# Patient Record
Sex: Female | Born: 1965 | Race: White | Hispanic: No | Marital: Married | Smoking: Former smoker
Health system: Southern US, Community
[De-identification: ages and names within clinical notes are randomized; demographics above are authoritative.]

## PROBLEM LIST (undated history)

## (undated) DIAGNOSIS — F32A Depression, unspecified: Secondary | ICD-10-CM

## (undated) DIAGNOSIS — F419 Anxiety disorder, unspecified: Secondary | ICD-10-CM

## (undated) DIAGNOSIS — J302 Other seasonal allergic rhinitis: Secondary | ICD-10-CM

## (undated) DIAGNOSIS — F329 Major depressive disorder, single episode, unspecified: Secondary | ICD-10-CM

## (undated) DIAGNOSIS — E785 Hyperlipidemia, unspecified: Secondary | ICD-10-CM

## (undated) HISTORY — DX: Major depressive disorder, single episode, unspecified: F32.9

## (undated) HISTORY — DX: Anxiety disorder, unspecified: F41.9

## (undated) HISTORY — PX: COLONOSCOPY: SHX174

## (undated) HISTORY — DX: Hyperlipidemia, unspecified: E78.5

## (undated) HISTORY — DX: Depression, unspecified: F32.A

## (undated) HISTORY — DX: Other seasonal allergic rhinitis: J30.2

---

## 1985-10-05 HISTORY — PX: TUBAL LIGATION: SHX77

## 1998-05-09 ENCOUNTER — Other Ambulatory Visit: Admission: RE | Admit: 1998-05-09 | Discharge: 1998-05-09 | Payer: Self-pay

## 2001-08-16 ENCOUNTER — Other Ambulatory Visit: Admission: RE | Admit: 2001-08-16 | Discharge: 2001-08-16 | Payer: Self-pay | Admitting: Family Medicine

## 2004-10-28 ENCOUNTER — Other Ambulatory Visit: Admission: RE | Admit: 2004-10-28 | Discharge: 2004-10-28 | Payer: Self-pay | Admitting: Family Medicine

## 2013-03-17 ENCOUNTER — Encounter: Payer: Self-pay | Admitting: General Practice

## 2013-03-17 ENCOUNTER — Ambulatory Visit (INDEPENDENT_AMBULATORY_CARE_PROVIDER_SITE_OTHER): Payer: BC Managed Care – PPO

## 2013-03-17 ENCOUNTER — Ambulatory Visit (INDEPENDENT_AMBULATORY_CARE_PROVIDER_SITE_OTHER): Payer: BC Managed Care – PPO | Admitting: General Practice

## 2013-03-17 VITALS — BP 129/83 | HR 76 | Temp 98.9°F | Ht 65.5 in | Wt 145.0 lb

## 2013-03-17 DIAGNOSIS — R0789 Other chest pain: Secondary | ICD-10-CM

## 2013-03-17 DIAGNOSIS — R071 Chest pain on breathing: Secondary | ICD-10-CM

## 2013-03-17 DIAGNOSIS — R079 Chest pain, unspecified: Secondary | ICD-10-CM

## 2013-03-17 DIAGNOSIS — M940 Chondrocostal junction syndrome [Tietze]: Secondary | ICD-10-CM

## 2013-03-17 NOTE — Patient Instructions (Addendum)
Costochondritis Costochondritis (Tietze syndrome), or costochondral separation, is a swelling and irritation (inflammation) of the tissue (cartilage) that connects your ribs with your breastbone (sternum). It may occur on its own (spontaneously), through damage caused by an accident (trauma), or simply from coughing or minor exercise. It may take up to 6 weeks to get better and longer if you are unable to be conservative in your activities. HOME CARE INSTRUCTIONS   Avoid exhausting physical activity. Try not to strain your ribs during normal activity. This would include any activities using chest, belly (abdominal), and side muscles, especially if heavy weights are used.  Use ice for 15-20 minutes per hour while awake for the first 2 days. Place the ice in a plastic bag, and place a towel between the bag of ice and your skin.  Only take over-the-counter or prescription medicines for pain, discomfort, or fever as directed by your caregiver. SEEK IMMEDIATE MEDICAL CARE IF:   Your pain increases or you are very uncomfortable.  You have a fever.  You develop difficulty with your breathing.  You cough up blood.  You develop worse chest pains, shortness of breath, sweating, or vomiting.  You develop new, unexplained problems (symptoms). MAKE SURE YOU:   Understand these instructions.  Will watch your condition.  Will get help right away if you are not doing well or get worse. Document Released: 07/01/2005 Document Revised: 12/14/2011 Document Reviewed: 05/09/2008 [employer] Patient Information 2014 [employer].  

## 2013-03-17 NOTE — Progress Notes (Signed)
  Subjective:    Patient ID: Michelle Stafford, female    DOB: 1965/10/14, 47 y.o.   MRN: [id number]  Chest Pain  This is a new problem. The current episode started yesterday. The onset quality is gradual. The problem occurs constantly. The problem has been gradually worsening. The pain is present in the substernal region. The pain is at a severity of 7/10. The pain is moderate. Quality: Throbbing pain. The pain does not radiate. Associated symptoms include shortness of breath. Pertinent negatives include no abdominal pain, back pain, cough, dizziness, fever, headaches, leg pain, lower extremity edema, nausea, palpitations, vomiting or weakness. The pain is aggravated by deep breathing, lifting arm and movement.  Reports being at work when pain began. 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 Denies knowingly injuring self.     Review of Systems  Constitutional: Negative for fever.  HENT: Negative for neck pain and neck stiffness.   Respiratory: Positive for shortness of breath. Negative for cough and chest tightness.   Cardiovascular: Positive for chest pain. Negative for palpitations.  Gastrointestinal: Negative for nausea, vomiting and abdominal pain.  Genitourinary: Negative for difficulty urinating.  Musculoskeletal: Positive for myalgias. Negative for back pain.  Neurological: Negative for dizziness, weakness and headaches.       Objective:   Physical Exam  Constitutional: She is oriented to person, place, and time. She appears well-developed and well-nourished.  HENT:  Head: Normocephalic and atraumatic.  Cardiovascular: Normal rate, regular rhythm and normal heart sounds.   Pulmonary/Chest: Effort normal and breath sounds normal. No respiratory distress. She exhibits tenderness.  Mid upper chest tenderness, pain reproducible with palpation  Abdominal: Soft. Bowel sounds are normal. She exhibits no distension. There is no tenderness.  Neurological: She is  alert and oriented to person, place, and time.  Skin: Skin is warm and dry.  Psychiatric: She has a normal mood and affect.   [facility] reading (PRIMARY) by Coralie Keens, FNP-C, no fracture or dislocation noted.                                      Assessment & Plan:  1. Chest pain - EKG 12-Lead (normal sinus rhythm) - DG Chest 2 View; Future  2. Tenderness of chest wall - DG Chest 2 View; Future  3. Costochondritis -avoid strenuous activity -ice to affected area for 10 to 15 minutes every hour while awake for first 2 days -rest -motrin OTC as directed for minor discomfort as directed -RTO if symptoms worsen or unresolved -call 911 if chest tightness, shortness of breath, left arm pain -Patient verbalized understanding -Coralie Keens, FNP-C

## 2013-04-03 ENCOUNTER — Ambulatory Visit (INDEPENDENT_AMBULATORY_CARE_PROVIDER_SITE_OTHER): Payer: BC Managed Care – PPO | Admitting: Nurse Practitioner

## 2013-04-03 ENCOUNTER — Encounter: Payer: Self-pay | Admitting: Nurse Practitioner

## 2013-04-03 VITALS — BP 146/89 | HR 59 | Temp 97.2°F | Ht 65.25 in | Wt 148.0 lb

## 2013-04-03 DIAGNOSIS — S161XXA Strain of muscle, fascia and tendon at neck level, initial encounter: Secondary | ICD-10-CM

## 2013-04-03 DIAGNOSIS — Z01419 Encounter for gynecological examination (general) (routine) without abnormal findings: Secondary | ICD-10-CM

## 2013-04-03 DIAGNOSIS — S139XXA Sprain of joints and ligaments of unspecified parts of neck, initial encounter: Secondary | ICD-10-CM

## 2013-04-03 DIAGNOSIS — Z0000 Encounter for general adult medical examination without abnormal findings: Secondary | ICD-10-CM

## 2013-04-03 LAB — POCT UA - MICROSCOPIC ONLY
Bacteria, U Microscopic: NEGATIVE
Casts, Ur, LPF, POC: NEGATIVE
Mucus, UA: NEGATIVE
Yeast, UA: NEGATIVE

## 2013-04-03 LAB — POCT CBC
HCT, POC: 37.9 % (ref 37.7–47.9)
Hemoglobin: 13.4 g/dL (ref 12.2–16.2)
MCH, POC: 31 pg (ref 27–31.2)
MCHC: 35.3 g/dL (ref 31.8–35.4)
POC Granulocyte: 5.9 (ref 2–6.9)
RBC: 4.3 M/uL (ref 4.04–5.48)

## 2013-04-03 LAB — POCT URINALYSIS DIPSTICK
Ketones, UA: NEGATIVE
Protein, UA: NEGATIVE
Spec Grav, UA: 1.015
pH, UA: 6

## 2013-04-03 MED ORDER — CYCLOBENZAPRINE HCL 5 MG PO TABS: 5.0000 mg | ORAL_TABLET | Freq: Three times a day (TID) | ORAL | Status: DC | PRN

## 2013-04-03 NOTE — Patient Instructions (Signed)

## 2013-04-03 NOTE — Progress Notes (Signed)
  Subjective:    Patient ID: Michelle Stafford, female    DOB: 01/25/1966, 47 y.o.   MRN: [id number]  HPI patient here today for a CPE and PAP- Doing well overall. Has a couple of complaints: 1.knot on right fifth finger- some better- has affected nail 2.Neck pain and stiffness for several months- Gets worse when working on computer- No numbness or tingling down arms.    Review of Systems  Constitutional: Negative.   HENT: Negative.   Eyes: Negative.   Respiratory: Negative.   Cardiovascular: Negative.   Gastrointestinal: Negative.   Endocrine: Negative.   Genitourinary: Negative.   Musculoskeletal: Negative.   Allergic/Immunologic: Negative.   Neurological: Negative.   Hematological: Negative.   Psychiatric/Behavioral: Negative.        Objective:   Physical Exam  Constitutional: She is oriented to person, place, and time. She appears well-developed and well-nourished.  HENT:  Head: Normocephalic.  Right Ear: Hearing, tympanic membrane, external ear and ear canal normal.  Left Ear: Hearing, tympanic membrane, external ear and ear canal normal.  Nose: Nose normal.  Mouth/Throat: Uvula is midline and oropharynx is clear and moist.  Eyes: Conjunctivae and EOM are normal. Pupils are equal, round, and reactive to light.  Neck: Normal range of motion and full passive range of motion without pain. Neck supple. No JVD present. Carotid bruit is not present. No mass and no thyromegaly present.  Cardiovascular: Normal rate, normal heart sounds and intact distal pulses.   No murmur heard. Pulmonary/Chest: Effort normal and breath sounds normal. Right breast exhibits no inverted nipple, no mass, no nipple discharge, no skin change and no tenderness. Left breast exhibits no inverted nipple, no mass, no nipple discharge, no skin change and no tenderness.  Abdominal: Soft. Bowel sounds are normal. She exhibits no mass. There is no tenderness.  Genitourinary: Vagina normal and uterus normal. No  breast swelling, tenderness, discharge or bleeding.  bimanual exam-No adnexal masses or tenderness.  Cervix parous and pink- no discharge  Musculoskeletal: Normal range of motion.  Lymphadenopathy:    She has no cervical adenopathy.  Neurological: She is alert and oriented to person, place, and time.  Skin: Skin is warm and dry.  Psychiatric: She has a normal mood and affect. Her behavior is normal. Judgment and thought content normal.   BP 146/89  Pulse 59  Temp(Src) 97.2 F (36.2 C) (Oral)  Ht 5' 5.25" (1.657 m)  Wt 148 lb (67.132 kg)  BMI 24.45 kg/m2  LMP 03/10/2013        Assessment & Plan:   1. Annual physical exam   2. Neck strain, initial encounter   3. Encounter for routine gynecological examination    Orders Placed This Encounter  Procedures  . COMPLETE METABOLIC PANEL WITH GFR  . NMR Lipoprofile with Lipids  . Thyroid Panel With TSH  . Vitamin D 25 hydroxy  . POCT urinalysis dipstick  . POCT UA - Microscopic Only  . POCT CBC   Meds ordered this encounter  Medications  . cyclobenzaprine (FLEXERIL) 5 MG tablet    Sig: Take 1 tablet (5 mg total) by mouth 3 (three) times daily as needed for muscle spasms.    Dispense:  30 tablet    Refill:  1    Order Specific Question:  Supervising Provider    Answer:  Ernestina Penna [[id number]]   Diet and exercise encouraged Follow up in 6 months to a year Mary-Margaret Daphine Deutscher, FNP

## 2013-04-04 LAB — COMPLETE METABOLIC PANEL WITH GFR
ALT: 11 U/L (ref 0–35)
CO2: 28 mEq/L (ref 19–32)
Calcium: 9.2 mg/dL (ref 8.4–10.5)
Chloride: 102 mEq/L (ref 96–112)
GFR, Est African American: >89 mL/min
GFR, Est Non African American: >89 mL/min
Glucose, Bld: 82 mg/dL (ref 70–99)
Sodium: 137 mEq/L (ref 135–145)
Total Bilirubin: 0.3 mg/dL (ref 0.3–1.2)
Total Protein: 6.9 g/dL (ref 6.0–8.3)

## 2013-04-04 LAB — PAP IG W/ RFLX HPV ASCU

## 2013-04-04 LAB — THYROID PANEL WITH TSH: T4, Total: 10.4 ug/dL (ref 5.0–12.5)

## 2013-04-05 LAB — NMR LIPOPROFILE WITH LIPIDS
HDL Particle Number: 32.2 umol/L (ref >=30.5)
HDL Size: 9.3 nm (ref >=9.2)
HDL-C: 41 mg/dL (ref >=40)
LDL (calc): 104 mg/dL — ABNORMAL HIGH (ref <100)
LDL Particle Number: 1860 nmol/L — ABNORMAL HIGH (ref <1000)
LDL Size: 19.6 nm — ABNORMAL LOW (ref >20.5)
LP-IR Score: 75 — ABNORMAL HIGH (ref <=45)
Small LDL Particle Number: 1510 nmol/L — ABNORMAL HIGH (ref <=527)
VLDL Size: 62.2 nm — ABNORMAL HIGH (ref <=46.6)

## 2013-12-20 ENCOUNTER — Ambulatory Visit (INDEPENDENT_AMBULATORY_CARE_PROVIDER_SITE_OTHER): Payer: BC Managed Care – PPO | Admitting: General Practice

## 2013-12-20 ENCOUNTER — Encounter: Payer: Self-pay | Admitting: General Practice

## 2013-12-20 VITALS — BP 141/91 | HR 80 | Temp 98.3°F | Ht 65.25 in | Wt 156.8 lb

## 2013-12-20 DIAGNOSIS — J101 Influenza due to other identified influenza virus with other respiratory manifestations: Secondary | ICD-10-CM

## 2013-12-20 DIAGNOSIS — J111 Influenza due to unidentified influenza virus with other respiratory manifestations: Secondary | ICD-10-CM

## 2013-12-20 DIAGNOSIS — Z20828 Contact with and (suspected) exposure to other viral communicable diseases: Secondary | ICD-10-CM

## 2013-12-20 DIAGNOSIS — R51 Headache: Secondary | ICD-10-CM

## 2013-12-20 LAB — POCT INFLUENZA A/B
Influenza A, POC: NEGATIVE
Influenza B, POC: POSITIVE

## 2013-12-20 MED ORDER — OSELTAMIVIR PHOSPHATE 75 MG PO CAPS: 75.0000 mg | ORAL_CAPSULE | Freq: Two times a day (BID) | ORAL | Status: DC

## 2013-12-20 NOTE — Progress Notes (Signed)
   Subjective:    Patient ID: Michelle Stafford, female    DOB: 18-Jun-1966, 48 y.o.   MRN: [id number]  Headache  This is a new problem. The current episode started in the past 7 days. The problem occurs intermittently. The problem has been gradually improving. The pain is located in the frontal region. The pain does not radiate. The pain quality is not similar to prior headaches. The quality of the pain is described as aching. Associated symptoms include ear pain and rhinorrhea. Pertinent negatives include no dizziness, eye pain, fever, phonophobia, photophobia or weakness. Nothing aggravates the symptoms. She has tried nothing for the symptoms. There is no history of cluster headaches or migraine headaches.      Review of Systems  Constitutional: Negative for fever.  HENT: Positive for ear pain, postnasal drip and rhinorrhea.   Eyes: Negative for photophobia and pain.  Respiratory: Negative for chest tightness and shortness of breath.   Cardiovascular: Negative for chest pain and palpitations.  Neurological: Positive for headaches. Negative for dizziness and weakness.       Objective:   Physical Exam  Constitutional: She is oriented to person, place, and time. She appears well-developed and well-nourished.  HENT:  Head: Normocephalic and atraumatic.  Right Ear: External ear normal.  Left Ear: External ear normal.  Mouth/Throat: Oropharynx is clear and moist.  Eyes: Pupils are equal, round, and reactive to light.  Neck: Normal range of motion. Neck supple. No thyromegaly present.  Cardiovascular: Normal rate, regular rhythm and normal heart sounds.   Pulmonary/Chest: Effort normal and breath sounds normal. No respiratory distress. She exhibits no tenderness.  Abdominal: Soft. Bowel sounds are normal. She exhibits no distension. There is no tenderness.  Lymphadenopathy:    She has no cervical adenopathy.  Neurological: She is alert and oriented to person, place, and time.  Skin: Skin  is warm and dry.  Psychiatric: She has a normal mood and affect.      Results for orders placed in visit on 12/20/13  POCT INFLUENZA A/B      Result Value Ref Range   Influenza A, POC Negative     Influenza B, POC Positive         Assessment & Plan:   1. Exposure to the flu  - POCT Influenza A/B  2. Influenza B -discussed and provided information sheet on influenza -rest -motrin or tylenol for discomfort -RTO if symptoms worsen or unresolved Patient verbalized understanding Erby Pian, FNP-C

## 2013-12-20 NOTE — Patient Instructions (Signed)

## 2013-12-22 ENCOUNTER — Other Ambulatory Visit: Payer: Self-pay | Admitting: General Practice

## 2014-04-30 ENCOUNTER — Ambulatory Visit (INDEPENDENT_AMBULATORY_CARE_PROVIDER_SITE_OTHER): Payer: BC Managed Care – PPO | Admitting: Nurse Practitioner

## 2014-04-30 ENCOUNTER — Encounter: Payer: Self-pay | Admitting: Nurse Practitioner

## 2014-04-30 VITALS — BP 136/84 | HR 73 | Temp 97.5°F | Ht 65.25 in | Wt 152.4 lb

## 2014-04-30 DIAGNOSIS — Z0000 Encounter for general adult medical examination without abnormal findings: Secondary | ICD-10-CM

## 2014-04-30 DIAGNOSIS — Z01419 Encounter for gynecological examination (general) (routine) without abnormal findings: Secondary | ICD-10-CM

## 2014-04-30 DIAGNOSIS — Z23 Encounter for immunization: Secondary | ICD-10-CM

## 2014-04-30 DIAGNOSIS — Z124 Encounter for screening for malignant neoplasm of cervix: Secondary | ICD-10-CM

## 2014-04-30 LAB — POCT URINALYSIS DIPSTICK
Bilirubin, UA: NEGATIVE
GLUCOSE UA: NEGATIVE
KETONES UA: NEGATIVE
Nitrite, UA: NEGATIVE
Protein, UA: NEGATIVE
SPEC GRAV UA: 1.02
Urobilinogen, UA: NEGATIVE
pH, UA: 6

## 2014-04-30 LAB — POCT UA - MICROSCOPIC ONLY
Bacteria, U Microscopic: NEGATIVE
Casts, Ur, LPF, POC: NEGATIVE
Crystals, Ur, HPF, POC: NEGATIVE
MUCUS UA: NEGATIVE
Yeast, UA: NEGATIVE

## 2014-04-30 LAB — POCT CBC
GRANULOCYTE PERCENT: 68 % (ref 37–80)
HEMATOCRIT: 39.6 % (ref 37.7–47.9)
HEMOGLOBIN: 13.1 g/dL (ref 12.2–16.2)
Lymph, poc: 2.7 (ref 0.6–3.4)
MCH, POC: 29.1 pg (ref 27–31.2)
MCHC: 330 g/dL — AB (ref 31.8–35.4)
MCV: 87.9 fL (ref 80–97)
MPV: 8.6 fL (ref 0–99.8)
POC GRANULOCYTE: 6.6 (ref 2–6.9)
POC LYMPH %: 28.2 % (ref 10–50)
Platelet Count, POC: 322 10*3/uL (ref 142–424)
RBC: 4.5 M/uL (ref 4.04–5.48)
RDW, POC: 12.5 %
WBC: 9.7 10*3/uL (ref 4.6–10.2)

## 2014-04-30 NOTE — Progress Notes (Signed)
Subjective:    Patient ID: Michelle Stafford, female    DOB: 21-Sep-1966, 48 y.o.   MRN: [id number]  HPI Patient here today for CPE and PAP- She is doing well today without complaints- SHe is currentlyon no meds and has no medical problems.    Review of Systems  Constitutional: Negative.   HENT: Negative.   Respiratory: Negative.   Cardiovascular: Negative.   Gastrointestinal: Negative.   Genitourinary: Negative.   Neurological: Negative.   Psychiatric/Behavioral: Negative.   All other systems reviewed and are negative.      Objective:   Physical Exam  Constitutional: She is oriented to person, place, and time. She appears well-developed and well-nourished.  HENT:  Head: Normocephalic.  Right Ear: Hearing, tympanic membrane, external ear and ear canal normal.  Left Ear: Hearing, tympanic membrane, external ear and ear canal normal.  Nose: Nose normal.  Mouth/Throat: Uvula is midline and oropharynx is clear and moist.  Eyes: Conjunctivae and EOM are normal. Pupils are equal, round, and reactive to light.  Neck: Normal range of motion and full passive range of motion without pain. Neck supple. No JVD present. Carotid bruit is not present. No mass and no thyromegaly present.  Cardiovascular: Normal rate, normal heart sounds and intact distal pulses.   No murmur heard. Pulmonary/Chest: Effort normal and breath sounds normal. Right breast exhibits no inverted nipple, no mass, no nipple discharge, no skin change and no tenderness. Left breast exhibits no inverted nipple, no mass, no nipple discharge, no skin change and no tenderness.  Abdominal: Soft. Bowel sounds are normal. She exhibits no mass. There is no tenderness.  Genitourinary: Vagina normal and uterus normal. No breast swelling, tenderness, discharge or bleeding.  bimanual exam-No adnexal masses or tenderness. Cervix parous and pink- no vaginal discharge  Musculoskeletal: Normal range of motion.  Lymphadenopathy:    She  has no cervical adenopathy.  Neurological: She is alert and oriented to person, place, and time.  Skin: Skin is warm and dry.  Psychiatric: She has a normal mood and affect. Her behavior is normal. Judgment and thought content normal.    BP 136/84  Pulse 73  Temp(Src) 97.5 F (36.4 C) (Oral)  Ht 5' 5.25" (1.657 m)  Wt 152 lb 6.4 oz (69.128 kg)  BMI 25.18 kg/m2  LMP 03/10/2013  Results for orders placed in visit on 04/30/14  POCT URINALYSIS DIPSTICK      Result Value Ref Range   Color, UA straw     Clarity, UA cloudy     Glucose, UA neg     Bilirubin, UA neg     Ketones, UA neg     Spec Grav, UA 1.020     Blood, UA mod     pH, UA 6.0     Protein, UA neg     Urobilinogen, UA negative     Nitrite, UA neg     Leukocytes, UA moderate (2+)    POCT UA - MICROSCOPIC ONLY      Result Value Ref Range   WBC, Ur, HPF, POC 10-15     RBC, urine, microscopic 5-10     Bacteria, U Microscopic neg     Mucus, UA neg     Epithelial cells, urine per micros occ     Crystals, Ur, HPF, POC neg     Casts, Ur, LPF, POC neg     Yeast, UA neg          Assessment & Plan:   1.  Encounter for routine gynecological examination   2. Annual physical exam    Orders Placed This Encounter  Procedures  . POCT urinalysis dipstick  . POCT UA - Microscopic Only    Labs pending Health maintenance reviewed Diet and exercise encouraged Continue all meds Follow up  In 1 year   [place], FNP

## 2014-04-30 NOTE — Addendum Note (Signed)
Addended by: Waverly Ferrari on: 04/30/2014 11:59 AM   Modules accepted: Orders

## 2014-04-30 NOTE — Patient Instructions (Signed)

## 2014-05-01 LAB — PAP IG W/ RFLX HPV ASCU: PAP SMEAR COMMENT: 0

## 2014-05-01 LAB — CMP14+EGFR
A/G RATIO: 1.8 (ref 1.1–2.5)
ALT: 12 IU/L (ref 0–32)
AST: 18 IU/L (ref 0–40)
Albumin: 4.6 g/dL (ref 3.5–5.5)
Alkaline Phosphatase: 84 IU/L (ref 39–117)
BILIRUBIN TOTAL: 0.5 mg/dL (ref 0.0–1.2)
BUN/Creatinine Ratio: 21 (ref 9–23)
BUN: 14 mg/dL (ref 6–24)
CO2: 26 mmol/L (ref 18–29)
CREATININE: 0.68 mg/dL (ref 0.57–1.00)
Calcium: 9.5 mg/dL (ref 8.7–10.2)
Chloride: 99 mmol/L (ref 97–108)
GFR, EST AFRICAN AMERICAN: 119 mL/min/{1.73_m2} (ref >59)
GFR, EST NON AFRICAN AMERICAN: 103 mL/min/{1.73_m2} (ref >59)
GLOBULIN, TOTAL: 2.5 g/dL (ref 1.5–4.5)
GLUCOSE: 101 mg/dL — AB (ref 65–99)
Potassium: 4.3 mmol/L (ref 3.5–5.2)
Sodium: 140 mmol/L (ref 134–144)
TOTAL PROTEIN: 7.1 g/dL (ref 6.0–8.5)

## 2014-05-01 LAB — NMR, LIPOPROFILE
Cholesterol: 231 mg/dL — ABNORMAL HIGH (ref 100–199)
HDL Cholesterol by NMR: 41 mg/dL (ref >39)
HDL PARTICLE NUMBER: 32.7 umol/L (ref >=30.5)
LDL Particle Number: 2115 nmol/L — ABNORMAL HIGH (ref <1000)
LDL Size: 20.2 nm (ref >20.5)
LDLC SERPL CALC-MCNC: 129 mg/dL — ABNORMAL HIGH (ref 0–99)
LP-IR Score: 80 — ABNORMAL HIGH (ref <=45)
SMALL LDL PARTICLE NUMBER: 1311 nmol/L — AB (ref <=527)
TRIGLYCERIDES BY NMR: 307 mg/dL — AB (ref 0–149)

## 2014-05-01 LAB — THYROID PANEL WITH TSH
Free Thyroxine Index: 1.9 (ref 1.2–4.9)
T3 Uptake Ratio: 25 % (ref 24–39)
T4 TOTAL: 7.4 ug/dL (ref 4.5–12.0)
TSH: 2.36 u[IU]/mL (ref 0.450–4.500)

## 2015-01-16 ENCOUNTER — Ambulatory Visit (INDEPENDENT_AMBULATORY_CARE_PROVIDER_SITE_OTHER): Payer: BLUE CROSS/BLUE SHIELD | Admitting: Physician Assistant

## 2015-01-16 ENCOUNTER — Ambulatory Visit (INDEPENDENT_AMBULATORY_CARE_PROVIDER_SITE_OTHER): Payer: BLUE CROSS/BLUE SHIELD

## 2015-01-16 ENCOUNTER — Encounter: Payer: Self-pay | Admitting: Physician Assistant

## 2015-01-16 VITALS — BP 157/79 | HR 78 | Temp 97.3°F | Ht 65.25 in | Wt 159.0 lb

## 2015-01-16 DIAGNOSIS — M13161 Monoarthritis, not elsewhere classified, right knee: Secondary | ICD-10-CM | POA: Diagnosis not present

## 2015-01-16 MED ORDER — MELOXICAM 7.5 MG PO TABS: 7.5000 mg | ORAL_TABLET | Freq: Every day | ORAL | Status: DC

## 2015-01-16 NOTE — Patient Instructions (Signed)
Knee Sprain A knee sprain is a tear in the strong bands of tissue that connect the bones (ligaments) of your knee. HOME CARE  Raise (elevate) your injured knee to lessen puffiness (swelling).  To ease pain and puffiness, put ice on the injured area.  Put ice in a plastic bag.  Place a towel between your skin and the bag.  Leave the ice on for 20 minutes, 2-3 times a day.  Only take medicine as told by your doctor.  Do not leave your knee unprotected until pain and stiffness go away (usually 4-6 weeks).  If you have a cast or splint, do not get it wet. If your doctor told you to not take it off, cover it with a plastic bag when you shower or bathe. Do not swim.  Your doctor may have you do exercises to prevent or limit permanent weakness and stiffness. GET HELP RIGHT AWAY IF:   Your cast or splint becomes damaged.  Your pain gets worse.  You have a lot of pain, puffiness, or numbness below the cast or splint. MAKE SURE YOU:   Understand these instructions.  Will watch your condition.  Will get help right away if you are not doing well or get worse. Document Released: 09/09/2009 Document Revised: 09/26/2013 Document Reviewed: 05/30/2013 [facility] Patient Information 2015 [place], [place]. This information is not intended to replace advice given to you by your health care provider. Make sure you discuss any questions you have with your health care provider.

## 2015-01-16 NOTE — Progress Notes (Signed)
   Subjective:    Patient ID: Michelle Stafford, female    DOB: 1966-04-21, 49 y.o.   MRN: [id number]  HPI 49 y/o female presents with right lateral knee pain s/p fall 9 days ago on [place] [address]. [place] forward on both on both knees and hands. No pain at time of injury. "irritated, achey feeling" about an hour later. Stands on cement floor during work which irritates and aggravates the pain. Worse with driving. Better with ice and elevation. Has not took any medication.     Review of Systems  Musculoskeletal:       Right knee pain. No history of bruising        Objective:   Physical Exam  Musculoskeletal: Normal range of motion. She exhibits edema (right lateral patella ) and tenderness (mild ttp right superior lateral patella ).  Skin: No erythema.  Negative for ecchymosis  Nursing note and vitals reviewed.         Assessment & Plan:  1. Right knee pain: RICE. Mobic 7.5mg  as prescribed. May increase to 2 pills on week 2 if no improvement. Recheck in 1 week. Will order MRI if no improvement.

## 2015-05-02 ENCOUNTER — Other Ambulatory Visit: Payer: BLUE CROSS/BLUE SHIELD | Admitting: Nurse Practitioner

## 2015-06-03 ENCOUNTER — Encounter (INDEPENDENT_AMBULATORY_CARE_PROVIDER_SITE_OTHER): Payer: Self-pay

## 2015-06-03 ENCOUNTER — Ambulatory Visit (INDEPENDENT_AMBULATORY_CARE_PROVIDER_SITE_OTHER): Payer: BLUE CROSS/BLUE SHIELD

## 2015-06-03 ENCOUNTER — Encounter: Payer: Self-pay | Admitting: Nurse Practitioner

## 2015-06-03 ENCOUNTER — Ambulatory Visit (INDEPENDENT_AMBULATORY_CARE_PROVIDER_SITE_OTHER): Payer: BLUE CROSS/BLUE SHIELD | Admitting: Nurse Practitioner

## 2015-06-03 VITALS — BP 145/89 | HR 75 | Temp 98.0°F | Ht 65.0 in | Wt 154.0 lb

## 2015-06-03 DIAGNOSIS — Z0000 Encounter for general adult medical examination without abnormal findings: Secondary | ICD-10-CM

## 2015-06-03 DIAGNOSIS — Z01419 Encounter for gynecological examination (general) (routine) without abnormal findings: Secondary | ICD-10-CM | POA: Diagnosis not present

## 2015-06-03 DIAGNOSIS — Z1211 Encounter for screening for malignant neoplasm of colon: Secondary | ICD-10-CM | POA: Diagnosis not present

## 2015-06-03 DIAGNOSIS — E875 Hyperkalemia: Secondary | ICD-10-CM

## 2015-06-03 LAB — POCT URINALYSIS DIPSTICK
Bilirubin, UA: NEGATIVE
Glucose, UA: NEGATIVE
KETONES UA: NEGATIVE
NITRITE UA: NEGATIVE
PROTEIN UA: NEGATIVE
Spec Grav, UA: 1.01
Urobilinogen, UA: NEGATIVE
pH, UA: 6.5

## 2015-06-03 LAB — POCT UA - MICROSCOPIC ONLY
Casts, Ur, LPF, POC: NEGATIVE
Crystals, Ur, HPF, POC: NEGATIVE
MUCUS UA: NEGATIVE
Yeast, UA: NEGATIVE

## 2015-06-03 NOTE — Patient Instructions (Signed)

## 2015-06-03 NOTE — Progress Notes (Signed)
   Subjective:    Patient ID: Michelle Stafford, female    DOB: 03/17/1966, 49 y.o.   MRN: [id number]  HPI  Patient here today for CPE and PAP- She is doing well today without complaints- SHe is currentlyon no meds and has no medical problems.    Review of Systems  Constitutional: Negative.   HENT: Negative.   Respiratory: Negative.   Cardiovascular: Negative.   Gastrointestinal: Negative.   Genitourinary: Negative.   Neurological: Negative.   Psychiatric/Behavioral: Negative.   All other systems reviewed and are negative.      Objective:   Physical Exam  Constitutional: She is oriented to person, place, and time. She appears well-developed and well-nourished.  HENT:  Head: Normocephalic.  Right Ear: Hearing, tympanic membrane, external ear and ear canal normal.  Left Ear: Hearing, tympanic membrane, external ear and ear canal normal.  Nose: Nose normal.  Mouth/Throat: Uvula is midline and oropharynx is clear and moist.  Eyes: Conjunctivae and EOM are normal. Pupils are equal, round, and reactive to light.  Neck: Normal range of motion and full passive range of motion without pain. Neck supple. No JVD present. Carotid bruit is not present. No thyroid mass and no thyromegaly present.  Cardiovascular: Normal rate, normal heart sounds and intact distal pulses.   No murmur heard. Pulmonary/Chest: Effort normal and breath sounds normal. Right breast exhibits no inverted nipple, no mass, no nipple discharge, no skin change and no tenderness. Left breast exhibits no inverted nipple, no mass, no nipple discharge, no skin change and no tenderness.  Abdominal: Soft. Bowel sounds are normal. She exhibits no mass. There is no tenderness.  Genitourinary: Vagina normal and uterus normal. No breast swelling, tenderness, discharge or bleeding.  bimanual exam-No adnexal masses or tenderness. Cervix parous and pink- no vaginal discharge  Musculoskeletal: Normal range of motion.  Lymphadenopathy:      She has no cervical adenopathy.  Neurological: She is alert and oriented to person, place, and time.  Skin: Skin is warm and dry.  Psychiatric: She has a normal mood and affect. Her behavior is normal. Judgment and thought content normal.   BP 145/89 mmHg  Pulse 75  Temp(Src) 98 F (36.7 C) (Oral)  Ht 5' 5" (1.651 m)  Wt 154 lb (69.854 kg)  BMI 25.63 kg/m2  LMP 03/10/2013  Chest x ray- no cardiomagally disease-Preliminary reading by Mary Martin, FNP  [facility]  EKG- NSR-Mary-Margaret Martin, FNP         Assessment & Plan:   1. Annual physical exam - POCT UA - Microscopic Only - POCT urinalysis dipstick - DG Chest 2 View; Future - EKG 12-Lead - CBC with Differential/Platelet - CMP14+EGFR - Lipid panel - Thyroid Panel With TSH  2. Encounter for screening colonoscopy - Ambulatory referral to Gastroenterology  3. Encounter for routine gynecological examination - Pap IG w/ reflex to HPV when ASC-U  4. hypertension Patient does  Not want to do any meds for now- wants to monitor at home Call if blood pressure stays above 140 systolic  Labs pending Health maintenance reviewed Diet and exercise encouraged Continue all meds Follow up  In 6 months   Mary-Margaret Martin, FNP    

## 2015-06-04 LAB — CBC WITH DIFFERENTIAL/PLATELET
BASOS ABS: 0 10*3/uL (ref 0.0–0.2)
Basos: 0 %
EOS (ABSOLUTE): 0.2 10*3/uL (ref 0.0–0.4)
Eos: 2 %
HEMOGLOBIN: 13.7 g/dL (ref 11.1–15.9)
Hematocrit: 40.5 % (ref 34.0–46.6)
Immature Grans (Abs): 0 10*3/uL (ref 0.0–0.1)
Immature Granulocytes: 0 %
LYMPHS ABS: 2.4 10*3/uL (ref 0.7–3.1)
Lymphs: 22 %
MCH: 29.8 pg (ref 26.6–33.0)
MCHC: 33.8 g/dL (ref 31.5–35.7)
MCV: 88 fL (ref 79–97)
MONOCYTES: 7 %
MONOS ABS: 0.8 10*3/uL (ref 0.1–0.9)
Neutrophils Absolute: 7.3 10*3/uL — ABNORMAL HIGH (ref 1.4–7.0)
Neutrophils: 69 %
PLATELETS: 409 10*3/uL — AB (ref 150–379)
RBC: 4.59 x10E6/uL (ref 3.77–5.28)
RDW: 12.7 % (ref 12.3–15.4)
WBC: 10.6 10*3/uL (ref 3.4–10.8)

## 2015-06-04 LAB — CMP14+EGFR
A/G RATIO: 1.6 (ref 1.1–2.5)
ALT: 12 IU/L (ref 0–32)
AST: 17 IU/L (ref 0–40)
Albumin: 4.7 g/dL (ref 3.5–5.5)
Alkaline Phosphatase: 90 IU/L (ref 39–117)
BILIRUBIN TOTAL: 0.6 mg/dL (ref 0.0–1.2)
BUN/Creatinine Ratio: 11 (ref 9–23)
BUN: 9 mg/dL (ref 6–24)
CHLORIDE: 98 mmol/L (ref 97–108)
CO2: 23 mmol/L (ref 18–29)
Calcium: 9.5 mg/dL (ref 8.7–10.2)
Creatinine, Ser: 0.82 mg/dL (ref 0.57–1.00)
GFR calc non Af Amer: 84 mL/min/{1.73_m2} (ref >59)
GFR, EST AFRICAN AMERICAN: 96 mL/min/{1.73_m2} (ref >59)
Globulin, Total: 2.9 g/dL (ref 1.5–4.5)
Glucose: 98 mg/dL (ref 65–99)
POTASSIUM: 5.6 mmol/L — AB (ref 3.5–5.2)
Sodium: 140 mmol/L (ref 134–144)
Total Protein: 7.6 g/dL (ref 6.0–8.5)

## 2015-06-04 LAB — THYROID PANEL WITH TSH
FREE THYROXINE INDEX: 2.1 (ref 1.2–4.9)
T3 UPTAKE RATIO: 25 % (ref 24–39)
T4, Total: 8.3 ug/dL (ref 4.5–12.0)
TSH: 1.62 u[IU]/mL (ref 0.450–4.500)

## 2015-06-04 LAB — LIPID PANEL
Chol/HDL Ratio: 5.2 ratio units — ABNORMAL HIGH (ref 0.0–4.4)
Cholesterol, Total: 212 mg/dL — ABNORMAL HIGH (ref 100–199)
HDL: 41 mg/dL (ref >39)
LDL Calculated: 112 mg/dL — ABNORMAL HIGH (ref 0–99)
TRIGLYCERIDES: 294 mg/dL — AB (ref 0–149)
VLDL Cholesterol Cal: 59 mg/dL — ABNORMAL HIGH (ref 5–40)

## 2015-06-05 LAB — URINE CULTURE

## 2015-06-06 LAB — PAP IG W/ RFLX HPV ASCU: PAP Smear Comment: 0

## 2015-06-21 ENCOUNTER — Telehealth: Payer: Self-pay | Admitting: Nurse Practitioner

## 2015-06-21 NOTE — Telephone Encounter (Signed)
Patient aware of results and verbalizes understanding.  

## 2015-06-21 NOTE — Addendum Note (Signed)
Addended by: Earlene Plater on: 06/21/2015 10:45 AM   Modules accepted: Orders, SmartSet

## 2015-06-28 ENCOUNTER — Other Ambulatory Visit: Payer: BLUE CROSS/BLUE SHIELD

## 2015-06-28 DIAGNOSIS — E875 Hyperkalemia: Secondary | ICD-10-CM

## 2015-06-28 NOTE — Progress Notes (Signed)
Lab only 

## 2015-06-29 LAB — POTASSIUM: POTASSIUM: 4.4 mmol/L (ref 3.5–5.2)

## 2015-07-09 ENCOUNTER — Encounter: Payer: Self-pay | Admitting: Internal Medicine

## 2015-09-18 ENCOUNTER — Ambulatory Visit (AMBULATORY_SURGERY_CENTER): Payer: Self-pay | Admitting: *Deleted

## 2015-09-18 VITALS — Ht 67.0 in | Wt 157.2 lb

## 2015-09-18 DIAGNOSIS — Z1211 Encounter for screening for malignant neoplasm of colon: Secondary | ICD-10-CM

## 2015-09-18 NOTE — Progress Notes (Signed)
No allergies to eggs or soy. No problems with anesthesia.  Pt given Emmi instructions for colonoscopy  No oxygen use  No diet drug use  

## 2015-09-19 ENCOUNTER — Encounter: Payer: Self-pay | Admitting: Internal Medicine

## 2015-10-02 ENCOUNTER — Encounter: Payer: Self-pay | Admitting: Internal Medicine

## 2015-10-02 ENCOUNTER — Ambulatory Visit (AMBULATORY_SURGERY_CENTER): Payer: BLUE CROSS/BLUE SHIELD | Admitting: Internal Medicine

## 2015-10-02 VITALS — BP 115/56 | HR 54 | Temp 97.1°F | Resp 21 | Ht 67.0 in | Wt 157.0 lb

## 2015-10-02 DIAGNOSIS — D125 Benign neoplasm of sigmoid colon: Secondary | ICD-10-CM | POA: Diagnosis not present

## 2015-10-02 DIAGNOSIS — Z1211 Encounter for screening for malignant neoplasm of colon: Secondary | ICD-10-CM | POA: Diagnosis present

## 2015-10-02 DIAGNOSIS — K635 Polyp of colon: Secondary | ICD-10-CM

## 2015-10-02 DIAGNOSIS — D128 Benign neoplasm of rectum: Secondary | ICD-10-CM

## 2015-10-02 DIAGNOSIS — D123 Benign neoplasm of transverse colon: Secondary | ICD-10-CM

## 2015-10-02 MED ORDER — SODIUM CHLORIDE 0.9 % IV SOLN: 500.0000 mL | INTRAVENOUS | Status: DC

## 2015-10-02 NOTE — Op Note (Signed)
[place]  [employer]. [place], [address]   COLONOSCOPY PROCEDURE REPORT  PATIENT: Michelle Stafford, Michelle Stafford  MR#: [id number] BIRTHDATE: 03-26-1966 , 49  yrs. old GENDER: female ENDOSCOPIST: Eustace Quail, MD REFERRED [id number] Rockne Coons, N.P. PROCEDURE DATE:  10/02/2015 PROCEDURE:   Colonoscopy, screening and Colonoscopy with snare polypectomy x 3 First Screening Colonoscopy - Avg.  risk and is 50 yrs.  old or older Yes.  Prior Negative Screening - Now for repeat screening. N/A  History of Adenoma - Now for follow-up colonoscopy & has been > or = to 3 yrs.  N/A  Polyps removed today? Yes ASA CLASS:   Class I INDICATIONS:Screening for colonic neoplasia and Colorectal Neoplasm Risk Assessment for this procedure is average risk. MEDICATIONS: Monitored anesthesia care and Propofol 350 mg IV  DESCRIPTION OF PROCEDURE:   After the risks benefits and alternatives of the procedure were thoroughly explained, informed consent was obtained.  The digital rectal exam revealed no abnormalities of the rectum.   The LB [id number]  endoscope was introduced through the anus and advanced to the cecum, which was identified by both the appendix and ileocecal valve. No adverse events experienced.   The quality of the prep was excellent. (Suprep was used)  The instrument was then slowly withdrawn as the colon was fully examined. Estimated blood loss is zero unless otherwise noted in this procedure report.  COLON FINDINGS: Two polyps ranging between 3-63mm in size were found in the sigmoid colon and transverse colon.  A polypectomy was performed with a cold snare.   A pedunculated polyp measuring 9 mm in size was found in the rectum.  A polypectomy was performed using snare cautery.  The resection was complete, the polyp tissue was completely retrieved and sent to histology.   There was moderate diverticulosis in the ascending  and sigmoid colon.   The examination was otherwise  normal.  Retroflexed views revealed internal hemorrhoids. The time to cecum = 3.9 Withdrawal time = 12.0   The scope was withdrawn and the procedure completed. COMPLICATIONS: There were no immediate complications.  ENDOSCOPIC IMPRESSION: 1.   Two polyps  were found in the sigmoid colon and transverse colon; polypectomy was performed with a cold snare 2.   Pedunculated polyp was found in the rectum; polypectomy was performed using snare cautery 3.   Moderate diverticulosis was noted in the ascending colon and sigmoid colon 4.   The examination was otherwise normal  RECOMMENDATIONS: 1. Repeat Colonoscopy in 3 years or 5 years pending pathology results.  eSigned:  Eustace Quail, MD 10/02/2015 8:42 AM   cc: The Patient and Chevis Pretty, NP

## 2015-10-02 NOTE — Progress Notes (Signed)
Called to room to assist during endoscopic procedure.  Patient ID and intended procedure confirmed with present staff. Received instructions for my participation in the procedure from the performing physician.  

## 2015-10-02 NOTE — Progress Notes (Signed)
Patient awakening,vss,report to rn 

## 2015-10-02 NOTE — Patient Instructions (Addendum)
YOU HAD AN ENDOSCOPIC PROCEDURE TODAY AT THE [facility]:   Refer to the procedure report that was given to you for any specific questions about what was found during the examination.  If the procedure report does not answer your questions, please call your gastroenterologist to clarify.  If you requested that your care partner not be given the details of your procedure findings, then the procedure report has been included in a sealed envelope for you to review at your convenience later.  YOU SHOULD EXPECT: Some feelings of bloating in the abdomen. Passage of more gas than usual.  Walking can help get rid of the air that was put into your GI tract during the procedure and reduce the bloating. If you had a lower endoscopy (such as a colonoscopy or flexible sigmoidoscopy) you may notice spotting of blood in your stool or on the toilet paper. If you underwent a bowel prep for your procedure, you may not have a normal bowel movement for a few days.  Please Note:  You might notice some irritation and congestion in your nose or some drainage.  This is from the oxygen used during your procedure.  There is no need for concern and it should clear up in a day or so.  SYMPTOMS TO REPORT IMMEDIATELY:   Following lower endoscopy (colonoscopy or flexible sigmoidoscopy):  Excessive amounts of blood in the stool  Significant tenderness or worsening of abdominal pains  Swelling of the abdomen that is new, acute  Fever of 100F or higher    For urgent or emergent issues, a gastroenterologist can be reached at any hour by calling [phone].   DIET: Your first meal following the procedure should be a small meal and then it is ok to progress to your normal diet. Heavy or fried foods are harder to digest and may make you feel nauseous or bloated.  Likewise, meals heavy in dairy and vegetables can increase bloating.  Drink plenty of fluids but you should avoid alcoholic beverages for 24  hours.  ACTIVITY:  You should plan to take it easy for the rest of today and you should NOT DRIVE or use heavy machinery until tomorrow (because of the sedation medicines used during the test).    FOLLOW UP: Our staff will call the number listed on your records the next business day following your procedure to check on you and address any questions or concerns that you may have regarding the information given to you following your procedure. If we do not reach you, we will leave a message.  However, if you are feeling well and you are not experiencing any problems, there is no need to return our call.  We will assume that you have returned to your regular daily activities without incident.  If any biopsies were taken you will be contacted by phone or by letter within the next 1-3 weeks.  Please call us at [phone] if you have not heard about the biopsies in 3 weeks.    SIGNATURES/CONFIDENTIALITY: You and/or your care partner have signed paperwork which will be entered into your electronic medical record.  These signatures attest to the fact that that the information above on your After Visit Summary has been reviewed and is understood.  Full responsibility of the confidentiality of this discharge information lies with you and/or your care-partner.    Information on polyps & diverticulosis given to you today 

## 2015-10-03 ENCOUNTER — Telehealth: Payer: Self-pay | Admitting: *Deleted

## 2015-10-03 NOTE — Telephone Encounter (Signed)
  Follow up Call-  Call back number 10/02/2015  Post procedure Call Back phone  # [phone]  Permission to leave phone message Yes     Patient questions:  Do you have a fever, pain , or abdominal swelling? No. Pain Score  0 *  Have you tolerated food without any problems? Yes.    Have you been able to return to your normal activities? Yes.    Do you have any questions about your discharge instructions: Diet   No. Medications  No. Follow up visit  No.  Do you have questions or concerns about your Care? No.  Actions: * If pain score is 4 or above: No action needed, pain <4.

## 2015-10-08 ENCOUNTER — Encounter: Payer: Self-pay | Admitting: Internal Medicine

## 2015-11-06 ENCOUNTER — Encounter: Payer: Self-pay | Admitting: Pediatrics

## 2015-11-06 ENCOUNTER — Ambulatory Visit (INDEPENDENT_AMBULATORY_CARE_PROVIDER_SITE_OTHER): Payer: BLUE CROSS/BLUE SHIELD | Admitting: Pediatrics

## 2015-11-06 VITALS — BP 150/94 | HR 79 | Temp 98.0°F | Ht 67.0 in | Wt 155.6 lb

## 2015-11-06 DIAGNOSIS — R030 Elevated blood-pressure reading, without diagnosis of hypertension: Secondary | ICD-10-CM

## 2015-11-06 DIAGNOSIS — Z780 Asymptomatic menopausal state: Secondary | ICD-10-CM | POA: Diagnosis not present

## 2015-11-06 DIAGNOSIS — G4700 Insomnia, unspecified: Secondary | ICD-10-CM | POA: Diagnosis not present

## 2015-11-06 DIAGNOSIS — IMO0001 Reserved for inherently not codable concepts without codable children: Secondary | ICD-10-CM

## 2015-11-06 MED ORDER — CITALOPRAM HYDROBROMIDE 20 MG PO TABS: 20.0000 mg | ORAL_TABLET | Freq: Every day | ORAL | Status: DC

## 2015-11-06 NOTE — Progress Notes (Signed)
    Subjective:    Patient ID: Michelle Stafford, female    DOB: 03-12-1966, 50 y.o.   MRN: [id number]  CC: Hot Flashes; Night Sweats; Menopause; and Anxiety   HPI: Michelle Stafford is a 50 y.o. female presenting for Hot Flashes; Night Sweats; Menopause; and Anxiety  Having hot flashes for a couple of years Also with some night sweats Sleeping poorly Anxiety seems to be worse since starting menopause Has had anxiety before, worse over past 2-3 months Medicines in the past include lexapro,  Periods every month until 50yo   Depression screen [facility] 2/9 11/06/2015 06/03/2015 04/30/2014  Decreased Interest 0 0 0  Down, Depressed, Hopeless 0 0 0  PHQ - 2 Score 0 0 0     Relevant past medical, surgical, family and social history reviewed and updated as indicated. Interim medical history since our last visit reviewed. Allergies and medications reviewed and updated.    ROS: Per HPI unless specifically indicated above  History  Smoking status  . Former Smoker  . Quit date: 09/04/2012  Smokeless tobacco  . Never Used    Past Medical History There are no active problems to display for this patient.   Current Outpatient Prescriptions  Medication Sig Dispense Refill  . Multiple Vitamin (MULTIVITAMIN) tablet Take 1 tablet by mouth daily.    . Probiotic Product (TRUBIOTICS PO) Take by mouth daily.    . citalopram (CELEXA) 20 MG tablet Take 1 tablet (20 mg total) by mouth daily. 30 tablet 3   No current facility-administered medications for this visit.       Objective:    BP 150/94 mmHg  Pulse 79  Temp(Src) 98 F (36.7 C) (Oral)  Ht 5\' 7"  (1.702 m)  Wt 155 lb 9.6 oz (70.58 kg)  BMI 24.36 kg/m2  LMP 03/10/2013  Wt Readings from Last 3 Encounters:  11/06/15 155 lb 9.6 oz (70.58 kg)  10/02/15 157 lb (71.215 kg)  09/18/15 157 lb 3.2 oz (71.305 kg)     Gen: NAD, alert, cooperative with exam, NCAT EYES: EOMI, no scleral injection or icterus ENT:  TMs pearly gray b/l, OP  without erythema LYMPH: no cervical LAD CV: NRRR, normal S1/S2, no murmur, distal pulses 2+ b/l Resp: CTABL, no wheezes, normal WOB Abd: +BS, soft, NTND. no guarding or organomegaly Ext: No edema, warm Neuro: Alert and oriented, strength equal b/l UE and LE, coordination grossly normal MSK: normal muscle bulk     Assessment & Plan:    Tayanna was seen today for hot flashes, night sweats, menopause and anxiety.  Diagnoses and all orders for this visit:  Menopause -     citalopram (CELEXA) 20 MG tablet; Take 1 tablet (20 mg total) by mouth daily.  Elevated blood pressure Check BPs at home, bring numbers back to next visit.  Insomnia Sleep hygiene, melatonin, treat menopause symptoms as above, increase daily activity level  Follow up plan: Return in about 4 weeks (around 12/04/2015). Well exam, citalopram eval, BP check  Michelle Found, MD [facility] 11/06/2015, 5:04 PM

## 2015-11-06 NOTE — Patient Instructions (Signed)
Take half a tab of citalopram for 8 days, then take a full tab. Take once a day every day. If you want to stop this medicine we will have to taper you down like we started slowly, let me know and we can help get you off of it.  Melatonin 3-6mg  take 30 min before bedtime  Try to get in 20-30min of walking or other exercise 4-5 times a week

## 2015-11-07 ENCOUNTER — Telehealth: Payer: Self-pay | Admitting: Pediatrics

## 2015-11-07 NOTE — Telephone Encounter (Signed)
Pt had questions about taking celexa with the list of side effects. Pt reassured about the celexa for menopausal sxs.

## 2015-12-12 ENCOUNTER — Ambulatory Visit (INDEPENDENT_AMBULATORY_CARE_PROVIDER_SITE_OTHER): Payer: BLUE CROSS/BLUE SHIELD | Admitting: Pediatrics

## 2015-12-12 ENCOUNTER — Encounter: Payer: Self-pay | Admitting: Pediatrics

## 2015-12-12 ENCOUNTER — Encounter (INDEPENDENT_AMBULATORY_CARE_PROVIDER_SITE_OTHER): Payer: Self-pay

## 2015-12-12 VITALS — BP 141/91 | HR 80 | Temp 98.7°F | Ht 67.0 in | Wt 153.6 lb

## 2015-12-12 DIAGNOSIS — R030 Elevated blood-pressure reading, without diagnosis of hypertension: Secondary | ICD-10-CM | POA: Diagnosis not present

## 2015-12-12 DIAGNOSIS — J309 Allergic rhinitis, unspecified: Secondary | ICD-10-CM

## 2015-12-12 DIAGNOSIS — Z780 Asymptomatic menopausal state: Secondary | ICD-10-CM | POA: Diagnosis not present

## 2015-12-12 DIAGNOSIS — IMO0001 Reserved for inherently not codable concepts without codable children: Secondary | ICD-10-CM

## 2015-12-12 MED ORDER — CITALOPRAM HYDROBROMIDE 20 MG PO TABS: 20.0000 mg | ORAL_TABLET | Freq: Every day | ORAL | Status: DC

## 2015-12-12 NOTE — Progress Notes (Signed)
    Subjective:    Patient ID: Michelle Stafford, female    DOB: 21-Jan-1966, 50 y.o.   MRN: [id number]  CC: Medication Check   HPI: Michelle Stafford is a 50 y.o. female presenting for Medication Check  Celexa 20mg  daily has been helping some with anxiety At first she thought it might be making it worse, but better over the last week Still with hotflashes, maybe slightly better Driving is getting easier on 68 Not needing to pull over while driving anymore  Past few days since walking in the woods has had a lot of clear constant thin nasal drainage Has seasonal allergies  BPs at home low 140s/70s-80s One reading 130/74 No headaches, vision changes, chest pain   Depression screen [facility] 2/9 12/12/2015 11/06/2015 06/03/2015 04/30/2014  Decreased Interest 0 0 0 0  Down, Depressed, Hopeless 0 0 0 0  PHQ - 2 Score 0 0 0 0     Relevant past medical, surgical, family and social history reviewed and updated as indicated. Interim medical history since our last visit reviewed. Allergies and medications reviewed and updated.    ROS: Per HPI unless specifically indicated above  History  Smoking status  . Former Smoker  . Quit date: 09/04/2012  Smokeless tobacco  . Never Used    Past Medical History There are no active problems to display for this patient.      Objective:    BP 141/91 mmHg  Pulse 80  Temp(Src) 98.7 F (37.1 C) (Oral)  Ht 5\' 7"  (1.702 m)  Wt 153 lb 9.6 oz (69.673 kg)  BMI 24.05 kg/m2  LMP 03/10/2013  Wt Readings from Last 3 Encounters:  12/12/15 153 lb 9.6 oz (69.673 kg)  11/06/15 155 lb 9.6 oz (70.58 kg)  10/02/15 157 lb (71.215 kg)     Gen: NAD, alert, cooperative with exam, NCAT EYES: EOMI, no scleral injection or icterus ENT:  R TM with clear effusion, no redness, nl LR, L TM slightly pink, no effusion. OP with mild erythema LYMPH: <1 cm ant cervical LAD CV: NRRR, normal S1/S2, no murmur, distal pulses 2+ b/l Resp: CTABL, no wheezes, normal WOB Abd:  +BS, soft, NTND. no guarding or organomegaly Ext: No edema, warm Neuro: Alert and oriented, strength equal b/l UE and LE, coordination grossly normal MSK: normal muscle bulk Psych: normal affect     Assessment & Plan:    Anayancy was seen today for medication check and below problems.  Diagnoses and all orders for this visit:  Allergic rhinitis, unspecified allergic rhinitis type Start cetirizine and flonase, take daily  Elevated blood pressure Discussed lifestyle changes vs starting medicines. Pt wants to try lifestyle first, discussed increased exercise, fruits/veg, decreased salt. Will continue to regularly check BPs at home, will let me know if regularly >140 or >90.  Menopause and anxiety Symptoms somewhat improved after 4 weeks citalopram 20mg . Pt doesn't want to increase dose yet. Will let me know if still symptomatic in another 2-4 weeks, increase to 40mg  if needed. -     citalopram (CELEXA) 20 MG tablet; Take 1 tablet (20 mg total) by mouth daily.   Follow up plan: Return in about 4 months (around 04/12/2016).  Michelle Found, MD [facility] 12/12/2015, 5:52 PM

## 2015-12-12 NOTE — Patient Instructions (Signed)
Flonase two sprays each side once a day  Cetirizine 10mg  daily

## 2016-04-15 IMAGING — CR DG CHEST 2V
2 series · 2 of 2 positions shown · non-contrast
Comparison: 03/17/2013

CLINICAL DATA: Annual physical exam.

EXAM:
CHEST  2 VIEW

[view not recorded (1 of 2)]
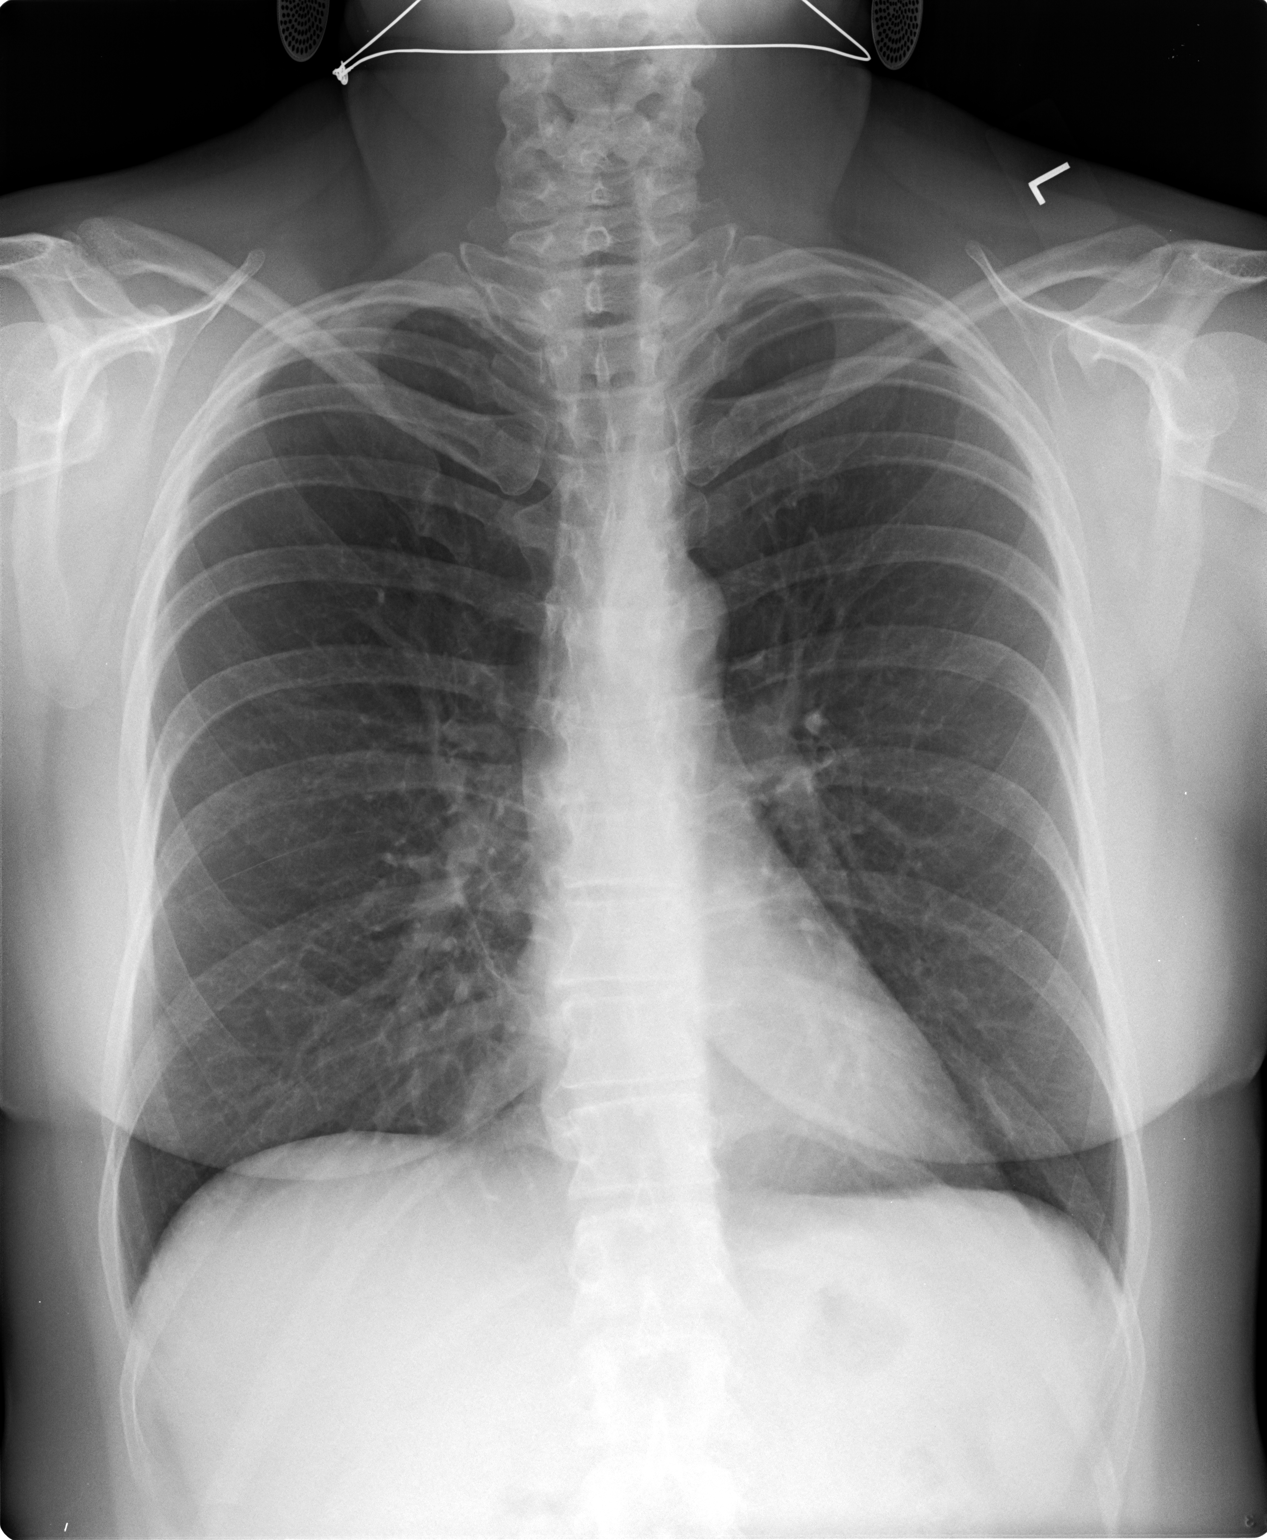

[view not recorded (2 of 2)]
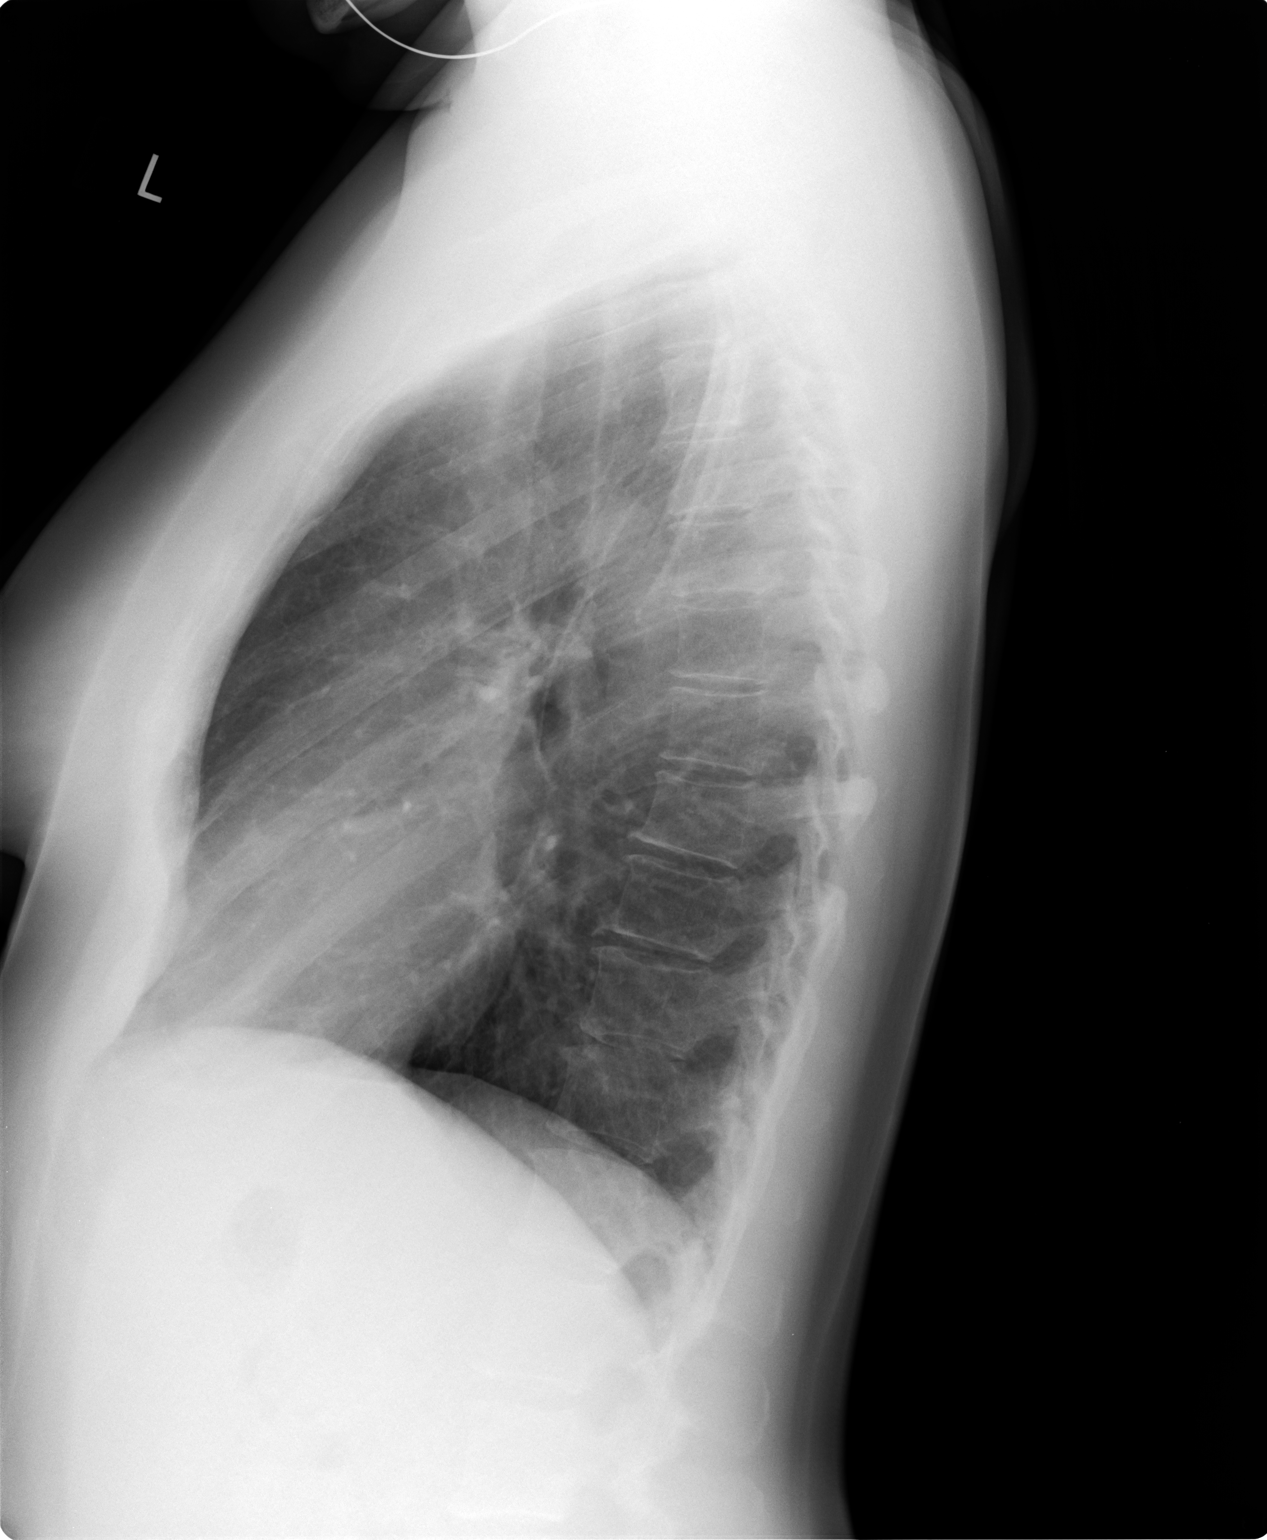

[2 of 2 positions shown; findings below may reference images not displayed]

FINDINGS: The heart size and mediastinal contours are within normal limits.
Both lungs are clear. No pleural effusion or pneumothorax. The
visualized skeletal structures are unremarkable.
IMPRESSION: No active cardiopulmonary disease.

## 2016-04-30 ENCOUNTER — Other Ambulatory Visit: Payer: Self-pay | Admitting: *Deleted

## 2016-04-30 DIAGNOSIS — Z780 Asymptomatic menopausal state: Secondary | ICD-10-CM

## 2016-04-30 MED ORDER — CITALOPRAM HYDROBROMIDE 20 MG PO TABS: 20.0000 mg | ORAL_TABLET | Freq: Every day | ORAL | 0 refills | Status: DC

## 2016-05-01 ENCOUNTER — Other Ambulatory Visit: Payer: Self-pay

## 2016-05-01 DIAGNOSIS — Z780 Asymptomatic menopausal state: Secondary | ICD-10-CM

## 2016-05-01 MED ORDER — CITALOPRAM HYDROBROMIDE 20 MG PO TABS: 20.0000 mg | ORAL_TABLET | Freq: Every day | ORAL | 0 refills | Status: DC

## 2016-08-05 ENCOUNTER — Other Ambulatory Visit: Payer: Self-pay | Admitting: Pediatrics

## 2016-08-05 DIAGNOSIS — Z780 Asymptomatic menopausal state: Secondary | ICD-10-CM

## 2016-09-24 ENCOUNTER — Encounter: Payer: Self-pay | Admitting: Pediatrics

## 2016-09-24 ENCOUNTER — Ambulatory Visit (INDEPENDENT_AMBULATORY_CARE_PROVIDER_SITE_OTHER): Payer: BLUE CROSS/BLUE SHIELD | Admitting: Pediatrics

## 2016-09-24 VITALS — BP 125/79 | HR 71 | Temp 98.0°F | Ht 67.0 in | Wt 160.0 lb

## 2016-09-24 DIAGNOSIS — F419 Anxiety disorder, unspecified: Secondary | ICD-10-CM

## 2016-09-24 DIAGNOSIS — Z780 Asymptomatic menopausal state: Secondary | ICD-10-CM | POA: Insufficient documentation

## 2016-09-24 MED ORDER — CITALOPRAM HYDROBROMIDE 40 MG PO TABS: 40.0000 mg | ORAL_TABLET | Freq: Every day | ORAL | 1 refills | Status: DC

## 2016-09-24 NOTE — Progress Notes (Signed)
  Subjective:   Patient ID: Michelle Stafford, female    DOB: Aug 17, 1966, 50 y.o.   MRN: [id number] CC: Medication check  HPI: Michelle Stafford is a 50 y.o. 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 Allergies and medications reviewed and updated. History  Smoking Status  . Former Smoker  . Quit date: 09/04/2012  Smokeless Tobacco  . Never Used   ROS: Per HPI   Objective:    BP 125/79   Pulse 71   Temp 98 F (36.7 C) (Oral)   Ht 5\' 7"  (1.702 m)   Wt 160 lb (72.6 kg)   LMP 03/10/2013   BMI 25.06 kg/m   Wt Readings from Last 3 Encounters:  09/24/16 160 lb (72.6 kg)  12/12/15 153 lb 9.6 oz (69.7 kg)  11/06/15 155 lb 9.6 oz (70.6 kg)    Gen: NAD, alert, cooperative with exam, NCAT EYES: EOMI, no conjunctival injection, or no icterus ENT:  TMs pearly gray b/l, OP without erythema LYMPH: no cervical LAD CV: NRRR, normal S1/S2, no murmur Resp: CTABL, no wheezes, normal WOB Abd: +BS, soft, NTND. no guarding or organomegaly Ext: No edema, warm Neuro: Alert and oriented  Assessment & Plan:  [name] was seen today for medication check.  Diagnoses and all orders for this visit:  Anxiety Symptoms improved, still present some. cont citalopram at 40mg   Menopause Hot flashes improving, increasing citalopram to 40mg  If libido decrease continues, can try off of citalopram If hot flashes worsen can try alternate med -     citalopram (CELEXA) 40 MG tablet; Take 1 tablet (40 mg total) by  mouth daily.   Follow up plan: 6 mo [name], MD [place]

## 2017-01-19 ENCOUNTER — Encounter: Payer: Self-pay | Admitting: Family

## 2017-01-19 ENCOUNTER — Ambulatory Visit (INDEPENDENT_AMBULATORY_CARE_PROVIDER_SITE_OTHER): Payer: BLUE CROSS/BLUE SHIELD | Admitting: Family

## 2017-01-19 VITALS — BP 119/77 | HR 73 | Temp 98.8°F | Ht 67.0 in | Wt 158.6 lb

## 2017-01-19 DIAGNOSIS — R197 Diarrhea, unspecified: Secondary | ICD-10-CM | POA: Diagnosis not present

## 2017-01-19 NOTE — Progress Notes (Signed)
   Subjective:    Patient ID: Michelle Stafford, female    DOB: 12/23/1965, 51 y.o.   MRN: [id number]  Diarrhea   This is a new problem. The current episode started 1 to 4 weeks ago. The problem occurs 5 to 10 times per day. The problem has been unchanged. The stool consistency is described as mucous. The patient states that diarrhea does not awaken her from sleep. Associated symptoms include abdominal pain (cramps) and bloating. Pertinent negatives include no fever, increased  flatus, myalgias or vomiting. Nothing aggravates the symptoms. Risk factors include recent antibiotic use (March 23). She has tried increased fluids for the symptoms. The treatment provided mild relief.      Review of Systems  Constitutional: Negative for fever.  Gastrointestinal: Positive for abdominal pain (cramps), bloating and diarrhea. Negative for flatus and vomiting.  Musculoskeletal: Negative for myalgias.  All other systems reviewed and are negative.      Objective:   Physical Exam  Constitutional: She is oriented to person, place, and time. She appears well-developed and well-nourished. No distress.  HENT:  Head: Normocephalic.  Cardiovascular: Normal rate, regular rhythm, normal heart sounds and intact distal pulses.   No murmur heard. Pulmonary/Chest: Effort normal and breath sounds normal. No respiratory distress. She has no wheezes.  Abdominal: Soft. Bowel sounds are normal. She exhibits no distension. There is no tenderness.  Musculoskeletal: Normal range of motion. She exhibits no edema or tenderness.  Neurological: She is alert and oriented to person, place, and time.  Skin: Skin is warm and dry.  Psychiatric: She has a normal mood and affect. Her behavior is normal. Judgment and thought content normal.  Vitals reviewed.     BP 119/77   Pulse 73   Temp 98.8 F (37.1 C) (Oral)   Ht 5\' 7"  (1.702 m)   Wt 158 lb 9.6 oz (71.9 kg)   LMP 03/10/2013   BMI 24.84 kg/m      Assessment & Plan:    1. Diarrhea, unspecified type -Start probiotic -Bland diet -Force fluids -I believe this is related to recent antibiotic, but will rule out C Diff -RTo prn  - Cdiff NAA+O+P+Stool Culture   Evelina Dun, FNP

## 2017-01-19 NOTE — Patient Instructions (Signed)

## 2017-01-20 ENCOUNTER — Other Ambulatory Visit: Payer: BLUE CROSS/BLUE SHIELD

## 2017-01-20 DIAGNOSIS — R197 Diarrhea, unspecified: Secondary | ICD-10-CM | POA: Diagnosis not present

## 2017-01-21 ENCOUNTER — Other Ambulatory Visit: Payer: Self-pay | Admitting: Family

## 2017-01-21 MED ORDER — VANCOMYCIN HCL 125 MG PO CAPS
125.0000 mg | ORAL_CAPSULE | Freq: Four times a day (QID) | ORAL | 0 refills | Status: DC
Start: 2017-01-21 — End: 2017-02-10

## 2017-01-26 LAB — CDIFF NAA+O+P+STOOL CULTURE
CDIFFPCR: POSITIVE — AB
E coli, Shiga toxin Assay: NEGATIVE

## 2017-02-08 ENCOUNTER — Telehealth: Payer: Self-pay | Admitting: Pediatrics

## 2017-02-08 DIAGNOSIS — R197 Diarrhea, unspecified: Secondary | ICD-10-CM

## 2017-02-08 NOTE — Telephone Encounter (Signed)
Diarrhea went away about a week ago from CC diff treated with PO vanc. First episode of c diff. Starting this morning having mucusy stools, "slime" sinks to the bottom of toilet. Sometimes feels like she has to have stool but can't. No blood in stool. If continues will come by for stool test to be retested for C diff.

## 2017-02-08 NOTE — Telephone Encounter (Signed)
Returned patient's phone call.  Patient was diagnosed with Cdiff on 01/19/17 and finished vancomycin and has bleached her house and has washed her hands constantly.  Patient feels like she has Cdiff again.  Patient is having multiple BMs daily with a white colored stool

## 2017-02-08 NOTE — Addendum Note (Signed)
Addended by: Eustaquio Maize on: 02/08/2017 05:35 PM   Modules accepted: Orders

## 2017-02-09 ENCOUNTER — Encounter: Payer: Self-pay | Admitting: *Deleted

## 2017-02-09 ENCOUNTER — Other Ambulatory Visit: Payer: BLUE CROSS/BLUE SHIELD

## 2017-02-09 ENCOUNTER — Other Ambulatory Visit: Payer: Self-pay | Admitting: Pediatrics

## 2017-02-09 DIAGNOSIS — R197 Diarrhea, unspecified: Secondary | ICD-10-CM | POA: Diagnosis not present

## 2017-02-10 ENCOUNTER — Encounter: Payer: Self-pay | Admitting: Pediatrics

## 2017-02-10 ENCOUNTER — Ambulatory Visit (INDEPENDENT_AMBULATORY_CARE_PROVIDER_SITE_OTHER): Payer: BLUE CROSS/BLUE SHIELD | Admitting: Pediatrics

## 2017-02-10 VITALS — BP 133/89 | HR 82 | Temp 99.4°F | Ht 67.0 in | Wt 153.4 lb

## 2017-02-10 DIAGNOSIS — K921 Melena: Secondary | ICD-10-CM | POA: Diagnosis not present

## 2017-02-10 DIAGNOSIS — R197 Diarrhea, unspecified: Secondary | ICD-10-CM | POA: Diagnosis not present

## 2017-02-10 NOTE — Addendum Note (Signed)
Addended by: Eustaquio Maize on: 02/10/2017 08:52 PM   Modules accepted: Level of Service

## 2017-02-10 NOTE — Progress Notes (Signed)
  Subjective:   Patient ID: Michelle Stafford, female    DOB: Oct 08, 1965, 51 y.o.   MRN: [id number] CC: Blood In Stools (Bright red, filled toilet, toilet paper covered in blood)  HPI: Michelle Stafford is a 52 y.o. female presenting for Blood In Stools (Bright red, filled toilet, toilet paper covered in blood)  Loose stools for two days Brought stool sample yesterday for c diff testing Was treated three weeks ago with PO vanc for C diff  This morning 5am had episode of BRB in toilet and on paper Since then seeing blood when wiping, small amount of blood maybe in stool, not as much as in the morning Not straining to use bathroom apprx 4 times diarrhea today Can see blood when she wipes Overnight last night woke up every 1-2 hrs for stooling  Today normal appetite No abd pain now or past few days Didn't have abd pain with C diff inf diagnosed 3 weeks ago  Colonoscopy 09/2015, with adenomatous polyps  Relevant past medical, surgical, family and social history reviewed. Allergies and medications reviewed and updated. History  Smoking Status  . Former Smoker  . Quit date: 09/04/2012  Smokeless Tobacco  . Never Used   ROS: Per HPI   Objective:    BP 133/89   Pulse 82   Temp 99.4 F (37.4 C) (Oral)   Ht _0  (1.702 m)   Wt 153 lb 6.4 oz (69.6 kg)   LMP 03/10/2013   BMI 24.03 kg/m   Wt Readings from Last 3 Encounters:  02/10/17 153 lb 6.4 oz (69.6 kg)  01/19/17 158 lb 9.6 oz (71.9 kg)  09/24/16 160 lb (72.6 kg)    Gen: NAD, alert, cooperative with exam, NCAT EYES: EOMI, no conjunctival injection, or no icterus CV: NRRR, normal S1/S2, no murmur, distal pulses 2+ b/l Resp: CTABL, no wheezes, normal WOB Abd: +BS, soft, NTND. no guarding or organomegaly Ext: No edema, warm Neuro: Alert and oriented  Assessment & Plan:  Michelle Stafford was seen today for blood in stools.  Diagnoses and all orders for this visit:  Bloody stool Diarrhea  c diff testing pending Check labs -      Ambulatory referral to Gastroenterology -     CBC with Differential/Platelet -     C-reactive protein -     Sedimentation rate -     CMP14+EGFR  Follow up plan: 2 weeks, sooner if needed [name], MD [place]

## 2017-02-11 ENCOUNTER — Telehealth: Payer: Self-pay | Admitting: Pediatrics

## 2017-02-11 LAB — CMP14+EGFR
ALK PHOS: 92 IU/L (ref 39–117)
ALT: 11 IU/L (ref 0–32)
AST: 24 IU/L (ref 0–40)
Albumin/Globulin Ratio: 1.6 (ref 1.2–2.2)
Albumin: 4.6 g/dL (ref 3.5–5.5)
BILIRUBIN TOTAL: 0.4 mg/dL (ref 0.0–1.2)
BUN/Creatinine Ratio: 12 (ref 9–23)
BUN: 11 mg/dL (ref 6–24)
CHLORIDE: 97 mmol/L (ref 96–106)
CO2: 27 mmol/L (ref 18–29)
CREATININE: 0.93 mg/dL (ref 0.57–1.00)
Calcium: 9.8 mg/dL (ref 8.7–10.2)
GFR calc Af Amer: 82 mL/min/{1.73_m2} (ref >59)
GFR calc non Af Amer: 71 mL/min/{1.73_m2} (ref >59)
GLUCOSE: 101 mg/dL — AB (ref 65–99)
Globulin, Total: 2.9 g/dL (ref 1.5–4.5)
Potassium: 5.2 mmol/L (ref 3.5–5.2)
Sodium: 140 mmol/L (ref 134–144)
Total Protein: 7.5 g/dL (ref 6.0–8.5)

## 2017-02-11 LAB — CBC WITH DIFFERENTIAL/PLATELET
BASOS: 1 %
Basophils Absolute: 0.1 10*3/uL (ref 0.0–0.2)
EOS (ABSOLUTE): 0.3 10*3/uL (ref 0.0–0.4)
EOS: 3 %
HEMATOCRIT: 38.6 % (ref 34.0–46.6)
Hemoglobin: 12.8 g/dL (ref 11.1–15.9)
IMMATURE GRANULOCYTES: 0 %
Immature Grans (Abs): 0 10*3/uL (ref 0.0–0.1)
LYMPHS ABS: 2.1 10*3/uL (ref 0.7–3.1)
Lymphs: 20 %
MCH: 29.4 pg (ref 26.6–33.0)
MCHC: 33.2 g/dL (ref 31.5–35.7)
MCV: 89 fL (ref 79–97)
MONOS ABS: 1 10*3/uL — AB (ref 0.1–0.9)
Monocytes: 9 %
NEUTROS ABS: 7.4 10*3/uL — AB (ref 1.4–7.0)
NEUTROS PCT: 67 %
Platelets: 368 10*3/uL (ref 150–379)
RBC: 4.36 x10E6/uL (ref 3.77–5.28)
RDW: 13.3 % (ref 12.3–15.4)
WBC: 10.9 10*3/uL — ABNORMAL HIGH (ref 3.4–10.8)

## 2017-02-11 LAB — C-REACTIVE PROTEIN: CRP: 96 mg/L — ABNORMAL HIGH (ref 0.0–4.9)

## 2017-02-11 LAB — SEDIMENTATION RATE: Sed Rate: 21 mm/hr (ref 0–40)

## 2017-02-11 LAB — CLOSTRIDIUM DIFFICILE EIA: C difficile Toxins A+B, EIA: NEGATIVE

## 2017-02-11 NOTE — Telephone Encounter (Signed)
Patient aware of lab results.

## 2017-02-19 ENCOUNTER — Other Ambulatory Visit: Payer: BLUE CROSS/BLUE SHIELD

## 2017-02-19 ENCOUNTER — Ambulatory Visit (INDEPENDENT_AMBULATORY_CARE_PROVIDER_SITE_OTHER): Payer: BLUE CROSS/BLUE SHIELD | Admitting: Nurse Practitioner

## 2017-02-19 ENCOUNTER — Encounter: Payer: Self-pay | Admitting: Nurse Practitioner

## 2017-02-19 ENCOUNTER — Encounter (INDEPENDENT_AMBULATORY_CARE_PROVIDER_SITE_OTHER): Payer: Self-pay

## 2017-02-19 VITALS — BP 98/68 | HR 76 | Ht 67.0 in | Wt 154.0 lb

## 2017-02-19 DIAGNOSIS — R197 Diarrhea, unspecified: Secondary | ICD-10-CM | POA: Diagnosis not present

## 2017-02-19 NOTE — Progress Notes (Signed)
     HPI: Patient is a 51 year old female known to Dr. Henrene Pastor. She had screening colonoscopy with polypectomy December 2016 with findings of diverticulosis. Three polyps removed,  2 of which were tubular adenomas without high-grade dysplasia.  She referred by PCP, Dr. [name] for bloody stool. Patient had antibiotics for dental procedures in March. She developed diarrhea and tested positive mid April for C. Difficile (positive toxigenic). Treated with 10 days of vancomycin with resolution of symptoms. She developed low grade temp and recurrent cramps and diarrhea Monday before last. On 2 occasions she passed a lot of blood in her stool. Stools have been loose with mucus and up to 6 times a day. Patient saw PCP 02/09/17, test for C. difficile toxin A/B was negative. Over the last 2-3 days the diarrhea has actually improved, stools now with some form. No blood in stools since Thursday for last. She was waking up at night to have a bowel movement but that is resolving. Yesterday she had 6 partially formed stools and so for 1 partially formed stool this morning. Patient has been on probiotics since April. No recent medication changes or dietary changes. Her appetite is good.     Past Medical History:  Diagnosis Date  . Anxiety   . Depression   . Seasonal allergies     Patient's surgical history, family medical history, social history, medications and allergies were all reviewed in Epic    Physical Exam: BP 98/68   Pulse 76   Ht 5\' 7"  (1.702 m)   Wt 154 lb (69.9 kg)   LMP 03/10/2013   BMI 24.12 kg/m   GENERAL: pleasant white female in NAD PSYCH: :Pleasant, cooperative, normal affect HEENT: Normocephalic, conjunctiva pink, mucous membranes moist, neck supple without masses CARDIAC:  RRR, no murmur heard, no peripheral edema PULM: Normal respiratory effort, lungs CTA bilaterally, no wheezing ABDOMEN:  soft, nontender, nondistended, no obvious masses, no hepatomegaly,  normal bowel  sounds SKIN:  turgor, no lesions seen Musculoskeletal:  Normal muscle tone, normal strength NEURO: Alert and oriented x 3, no focal neurologic deficits   ASSESSMENT and PLAN:  51 yo female with c-diff in mid April. Responded to Vancomycin but developed recurrent diarrhea several days ago. Stools forming, less frequent over last few day. Though improving we still need to rule out relapse.  Her CRP was 96 at PCP's office on 02/10/17. C-diff toxin A/B was negative but recommend retesting with a more sensitive study.  -Will obtain C-diff PCR as soon as she can deliver a specimen. If positive then retreat with vanco, this time with pulsed dose for first reoccurence.    Tye Savoy , NP 02/19/2017, 10:52 AM

## 2017-02-19 NOTE — Patient Instructions (Signed)
Please go to the basement level to our lab for stool tests.  We will call you with the results.

## 2017-02-20 ENCOUNTER — Telehealth: Payer: Self-pay | Admitting: Family Medicine

## 2017-02-20 LAB — CLOSTRIDIUM DIFFICILE BY PCR: CDIFFPCR: DETECTED — AB

## 2017-02-20 NOTE — Telephone Encounter (Signed)
Received a call from answering service that patient is positive for C diff. This test was ordered by Tye Savoy in GI clinic yesterday.  Called answering service back, re-direct this call to the GI service

## 2017-02-22 ENCOUNTER — Other Ambulatory Visit: Payer: Self-pay

## 2017-02-22 MED ORDER — VANCOMYCIN HCL 125 MG PO CAPS: ORAL_CAPSULE | ORAL | 0 refills | Status: DC

## 2017-02-23 NOTE — Progress Notes (Signed)
Assessment and plans reviewed  

## 2017-04-16 ENCOUNTER — Encounter: Payer: Self-pay | Admitting: *Deleted

## 2017-04-26 ENCOUNTER — Other Ambulatory Visit: Payer: Self-pay | Admitting: Pediatrics

## 2017-04-26 DIAGNOSIS — Z780 Asymptomatic menopausal state: Secondary | ICD-10-CM

## 2017-06-02 ENCOUNTER — Encounter: Payer: BLUE CROSS/BLUE SHIELD | Admitting: *Deleted

## 2017-06-02 ENCOUNTER — Encounter: Payer: BLUE CROSS/BLUE SHIELD | Admitting: Pediatrics

## 2017-06-03 ENCOUNTER — Encounter: Payer: BLUE CROSS/BLUE SHIELD | Admitting: Pediatrics

## 2017-06-25 ENCOUNTER — Encounter: Payer: Self-pay | Admitting: *Deleted

## 2017-07-27 ENCOUNTER — Other Ambulatory Visit: Payer: Self-pay | Admitting: Pediatrics

## 2017-07-27 DIAGNOSIS — Z780 Asymptomatic menopausal state: Secondary | ICD-10-CM

## 2017-07-27 NOTE — Telephone Encounter (Signed)
Last seen 02/10/17  Dr Evette Doffing

## 2017-08-24 ENCOUNTER — Encounter: Payer: BLUE CROSS/BLUE SHIELD | Admitting: Pediatrics

## 2017-08-25 ENCOUNTER — Encounter: Payer: Self-pay | Admitting: Pediatrics

## 2017-08-25 ENCOUNTER — Ambulatory Visit (INDEPENDENT_AMBULATORY_CARE_PROVIDER_SITE_OTHER): Payer: BLUE CROSS/BLUE SHIELD | Admitting: Pediatrics

## 2017-08-25 VITALS — BP 118/70 | HR 77 | Temp 99.1°F | Ht 67.0 in | Wt 161.2 lb

## 2017-08-25 DIAGNOSIS — Z0000 Encounter for general adult medical examination without abnormal findings: Secondary | ICD-10-CM | POA: Diagnosis not present

## 2017-08-25 NOTE — Progress Notes (Signed)
  Subjective:   Patient ID: Michelle Stafford, female    DOB: 03-15-1966, 51 y.o.   MRN: [id number] CC: Annual Exam and Gynecologic Exam  HPI: Michelle Stafford is a 51 y.o. 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 Allergies and medications reviewed and updated.  Not smoking now Social History   Tobacco Use  Smoking Status Former Smoker  . Last attempt to quit: 09/04/2012  . Years since quitting: 4.9  Smokeless Tobacco Never Used   ROS: All system neg other than what is in HPI  Objective:    BP 118/70   Pulse 77   Temp 99.1 F (37.3 C) (Oral)   Ht 5\' 7"  (1.702 m)   Wt 161 lb 3.2 oz (73.1 kg)   LMP 03/10/2013   BMI 25.25 kg/m   Wt Readings from Last 3 Encounters:  08/25/17 161 lb 3.2 oz (73.1 kg)  02/19/17 154 lb (69.9 kg)  02/10/17 153 lb 6.4 oz (69.6 kg)    Gen: NAD, alert, cooperative with exam, NCAT EYES: EOMI, no conjunctival injection, or no icterus ENT:  TMs pearly gray b/l, OP without erythema LYMPH: no cervical LAD CV: NRRR, normal S1/S2, no murmur, distal pulses 2+ b/l Resp: CTABL, no wheezes, normal WOB Abd: +BS, soft, NTND. no guarding or organomegaly Ext: No edema, warm Neuro: Alert and oriented, strength equal b/l UE and LE, coordination grossly normal MSK: normal muscle bulk GU: nl external female genitalia, cervix not friable, minimal discharge present in vaginal vault Breast: nl exam b/l  Assessment & Plan:  Michelle Stafford was seen today for annual exam and gynecologic exam.  Diagnoses and all orders for this visit:  Encounter for preventive care mammo due, phone number given -     Pap IG and HPV (high risk) DNA detection -     Lipid panel -     Basic Metabolic Panel   Follow up plan: Return  in about 1 year (around 08/25/2018). [name], MD [place]

## 2017-08-25 NOTE — Patient Instructions (Signed)
[phone] [facility], Call for appointment  

## 2017-08-26 LAB — BASIC METABOLIC PANEL
BUN / CREAT RATIO: 14 (ref 9–23)
BUN: 11 mg/dL (ref 6–24)
CO2: 25 mmol/L (ref 20–29)
CREATININE: 0.77 mg/dL (ref 0.57–1.00)
Calcium: 10 mg/dL (ref 8.7–10.2)
Chloride: 100 mmol/L (ref 96–106)
GFR, EST AFRICAN AMERICAN: 103 mL/min/{1.73_m2} (ref >59)
GFR, EST NON AFRICAN AMERICAN: 89 mL/min/{1.73_m2} (ref >59)
Glucose: 92 mg/dL (ref 65–99)
Potassium: 4.8 mmol/L (ref 3.5–5.2)
SODIUM: 141 mmol/L (ref 134–144)

## 2017-08-26 LAB — LIPID PANEL
CHOLESTEROL TOTAL: 272 mg/dL — AB (ref 100–199)
Chol/HDL Ratio: 5.4 ratio — ABNORMAL HIGH (ref 0.0–4.4)
HDL: 50 mg/dL (ref >39)
LDL Calculated: 171 mg/dL — ABNORMAL HIGH (ref 0–99)
Triglycerides: 256 mg/dL — ABNORMAL HIGH (ref 0–149)
VLDL Cholesterol Cal: 51 mg/dL — ABNORMAL HIGH (ref 5–40)

## 2017-08-27 LAB — PAP IG AND HPV HIGH-RISK
HPV, high-risk: POSITIVE — AB
PAP SMEAR COMMENT: 0

## 2018-02-01 ENCOUNTER — Other Ambulatory Visit: Payer: Self-pay | Admitting: Pediatrics

## 2018-02-01 DIAGNOSIS — Z780 Asymptomatic menopausal state: Secondary | ICD-10-CM

## 2018-02-01 NOTE — Telephone Encounter (Signed)
Last seen 08/25/17  Dr Evette Doffing

## 2018-05-02 ENCOUNTER — Other Ambulatory Visit: Payer: Self-pay | Admitting: Pediatrics

## 2018-05-02 DIAGNOSIS — Z780 Asymptomatic menopausal state: Secondary | ICD-10-CM

## 2018-05-02 NOTE — Telephone Encounter (Signed)
OV 08/25/17 RTC 1 yr. Script for menopause

## 2018-07-26 ENCOUNTER — Other Ambulatory Visit: Payer: Self-pay | Admitting: Pediatrics

## 2018-07-26 DIAGNOSIS — Z780 Asymptomatic menopausal state: Secondary | ICD-10-CM

## 2018-07-26 NOTE — Telephone Encounter (Signed)
Apt made 11/11

## 2018-07-26 NOTE — Telephone Encounter (Signed)
Last seen 08/25/17  Dr Evette Doffing  Needs to be seen

## 2018-08-02 ENCOUNTER — Other Ambulatory Visit: Payer: Self-pay

## 2018-08-02 DIAGNOSIS — Z780 Asymptomatic menopausal state: Secondary | ICD-10-CM

## 2018-08-02 NOTE — Telephone Encounter (Signed)
Last seen 08/25/17

## 2018-08-05 ENCOUNTER — Telehealth: Payer: Self-pay | Admitting: Pediatrics

## 2018-08-05 ENCOUNTER — Other Ambulatory Visit: Payer: Self-pay

## 2018-08-05 DIAGNOSIS — Z780 Asymptomatic menopausal state: Secondary | ICD-10-CM

## 2018-08-05 MED ORDER — CITALOPRAM HYDROBROMIDE 40 MG PO TABS: 40.0000 mg | ORAL_TABLET | Freq: Every day | ORAL | 0 refills | Status: DC

## 2018-08-05 NOTE — Telephone Encounter (Signed)
done

## 2018-08-15 ENCOUNTER — Encounter: Payer: Self-pay | Admitting: Pediatrics

## 2018-08-15 ENCOUNTER — Ambulatory Visit: Payer: BLUE CROSS/BLUE SHIELD | Admitting: Pediatrics

## 2018-08-15 VITALS — BP 129/87 | HR 57 | Temp 97.7°F | Ht 67.0 in | Wt 167.0 lb

## 2018-08-15 DIAGNOSIS — Z780 Asymptomatic menopausal state: Secondary | ICD-10-CM | POA: Diagnosis not present

## 2018-08-15 DIAGNOSIS — F419 Anxiety disorder, unspecified: Secondary | ICD-10-CM

## 2018-08-15 DIAGNOSIS — E785 Hyperlipidemia, unspecified: Secondary | ICD-10-CM

## 2018-08-15 MED ORDER — CITALOPRAM HYDROBROMIDE 40 MG PO TABS: 40.0000 mg | ORAL_TABLET | Freq: Every day | ORAL | 3 refills | Status: DC

## 2018-08-15 NOTE — Progress Notes (Signed)
  Subjective:   Patient ID: Aram Beecham, female    DOB: 1965-12-25, 52 y.o.   MRN: [id number] CC: Medical Management of Chronic Issues  HPI: Geni Skorupski is a 52 y.o. female   Started on citalopram a couple of years ago to help with hot flashes and anxiety.  Anxiety is been doing well, no trouble driving.  Still with hot flashes off and on.  She does think the citalopram has been helping.  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  Due for repeat.  Relevant past medical, surgical, family and social history reviewed. Allergies and medications reviewed and updated. Social History   Tobacco Use  Smoking Status Former Smoker  . Last attempt to quit: 09/04/2012  . Years since quitting: 5.9  Smokeless Tobacco Never Used   ROS: Per HPI   Objective:    BP 129/87   Pulse (!) 57   Temp 97.7 F (36.5 C) (Oral)   Ht 5' 7" (1.702 m)   Wt 167 lb (75.8 kg)   LMP 03/10/2013   BMI 26.16 kg/m   Wt Readings from Last 3 Encounters:  08/15/18 167 lb (75.8 kg)  08/25/17 161 lb 3.2 oz (73.1 kg)  02/19/17 154 lb (69.9 kg)     Gen: NAD, alert, cooperative with exam, NCAT EYES: EOMI, no conjunctival injection, or no icterus ENT:  TMs pearly gray b/l, OP without erythema LYMPH: no cervical LAD CV: NRRR, normal S1/S2, no murmur, distal pulses 2+ b/l Resp: CTABL, no wheezes, normal WOB Ext: No edema, warm Neuro: Alert and oriented, strength equal b/l UE and LE, coordination grossly normal MSK: normal muscle bulk  Assessment & Plan:  Leenah was seen today for medical management of chronic issues.  Diagnoses and all orders for this visit:  Menopause Anxiety Symptoms of hot flashes are stable.  Anxiety has been improved. -     citalopram (CELEXA) 40 MG tablet; Take 1 tablet (40 mg total) by mouth daily.  Hyperlipidemia, unspecified hyperlipidemia type Will return for fasting labs.  Not currently on medicine. -      BMP8+EGFR; Future -     Lipid panel; Future   Follow up plan: Return for well exam and pap smear when able. [name], MD [place]

## 2018-08-15 NOTE — Patient Instructions (Signed)
Return for blood work (fasting) when able.   Schedule pap smear today  Call for mammogram appt

## 2018-09-26 ENCOUNTER — Encounter: Payer: BLUE CROSS/BLUE SHIELD | Admitting: Pediatrics

## 2018-10-10 ENCOUNTER — Ambulatory Visit (INDEPENDENT_AMBULATORY_CARE_PROVIDER_SITE_OTHER): Payer: BLUE CROSS/BLUE SHIELD | Admitting: Pediatrics

## 2018-10-10 ENCOUNTER — Encounter: Payer: Self-pay | Admitting: Pediatrics

## 2018-10-10 VITALS — BP 130/80 | HR 66 | Temp 97.3°F | Ht 67.0 in | Wt 166.0 lb

## 2018-10-10 DIAGNOSIS — Z0000 Encounter for general adult medical examination without abnormal findings: Secondary | ICD-10-CM

## 2018-10-10 DIAGNOSIS — E785 Hyperlipidemia, unspecified: Secondary | ICD-10-CM

## 2018-10-10 DIAGNOSIS — Z1231 Encounter for screening mammogram for malignant neoplasm of breast: Secondary | ICD-10-CM | POA: Diagnosis not present

## 2018-10-10 LAB — HM MAMMOGRAPHY

## 2018-10-10 NOTE — Progress Notes (Signed)
  Subjective:   Patient ID: Michelle Stafford, female    DOB: 28-Aug-1966, 53 y.o.   MRN: [id number] CC: Annual Exam and Gynecologic Exam  HPI: Michelle Stafford is a 53 y.o. female   Feeling well overall.  Trying to stay active, eating fruits and vegetables, avoiding highly processed foods, sugary drinks.  Relevant past medical, surgical, family and social history reviewed. Allergies and medications reviewed and updated. Social History   Tobacco Use  Smoking Status Former Smoker  . Last attempt to quit: 09/04/2012  . Years since quitting: 6.1  Smokeless Tobacco Never Used   ROS: All systems negative other than what is in the HPI.  Objective:    BP 130/80   Pulse 66   Temp (!) 97.3 F (36.3 C) (Oral)   Ht 5\' 7"  (1.702 m)   Wt 166 lb (75.3 kg)   LMP 03/10/2013   BMI 26.00 kg/m   Wt Readings from Last 3 Encounters:  10/10/18 166 lb (75.3 kg)  08/15/18 167 lb (75.8 kg)  08/25/17 161 lb 3.2 oz (73.1 kg)    Gen: NAD, alert, cooperative with exam, NCAT EYES: EOMI, no conjunctival injection, or no icterus ENT:  TMs pearly gray b/l, OP without erythema LYMPH: no cervical LAD CV: NRRR, normal S1/S2, no murmur, distal pulses 2+ b/l Resp: CTABL, no wheezes, normal WOB Abd: +BS, soft, NTND. no guarding or organomegaly Ext: No edema, warm Neuro: Alert and oriented, strength equal b/l UE and LE, coordination grossly normal MSK: normal muscle bulk GU: Normal external female genitalia, normal-appearing cervix  Assessment & Plan:  Michelle Stafford was seen today for annual exam and gynecologic exam.  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 -     Pap IG and HPV (high risk) DNA detection   Follow up plan: Return in about 1 year (around 10/11/2019). Michelle Found, MD [place]

## 2018-10-12 LAB — PAP IG AND HPV HIGH-RISK: HPV, HIGH-RISK: NEGATIVE

## 2018-11-02 ENCOUNTER — Ambulatory Visit: Payer: BLUE CROSS/BLUE SHIELD | Admitting: Physician Assistant

## 2018-11-02 ENCOUNTER — Encounter: Payer: Self-pay | Admitting: Physician Assistant

## 2018-11-02 VITALS — BP 126/86 | HR 65 | Temp 99.2°F | Ht 67.0 in | Wt 164.6 lb

## 2018-11-02 DIAGNOSIS — J0110 Acute frontal sinusitis, unspecified: Secondary | ICD-10-CM

## 2018-11-02 DIAGNOSIS — J40 Bronchitis, not specified as acute or chronic: Secondary | ICD-10-CM | POA: Diagnosis not present

## 2018-11-02 DIAGNOSIS — E785 Hyperlipidemia, unspecified: Secondary | ICD-10-CM | POA: Diagnosis not present

## 2018-11-02 LAB — BMP8+EGFR
BUN/Creatinine Ratio: 14 (ref 9–23)
BUN: 10 mg/dL (ref 6–24)
CALCIUM: 9.4 mg/dL (ref 8.7–10.2)
CO2: 24 mmol/L (ref 20–29)
CREATININE: 0.73 mg/dL (ref 0.57–1.00)
Chloride: 100 mmol/L (ref 96–106)
GFR calc Af Amer: 109 mL/min/{1.73_m2} (ref >59)
GFR calc non Af Amer: 94 mL/min/{1.73_m2} (ref >59)
GLUCOSE: 98 mg/dL (ref 65–99)
POTASSIUM: 5 mmol/L (ref 3.5–5.2)
SODIUM: 140 mmol/L (ref 134–144)

## 2018-11-02 LAB — LIPID PANEL
Chol/HDL Ratio: 5.6 ratio — ABNORMAL HIGH (ref 0.0–4.4)
Cholesterol, Total: 251 mg/dL — ABNORMAL HIGH (ref 100–199)
HDL: 45 mg/dL (ref >39)
LDL Calculated: 161 mg/dL — ABNORMAL HIGH (ref 0–99)
Triglycerides: 223 mg/dL — ABNORMAL HIGH (ref 0–149)
VLDL CHOLESTEROL CAL: 45 mg/dL — AB (ref 5–40)

## 2018-11-02 MED ORDER — BENZONATATE 200 MG PO CAPS: 200.0000 mg | ORAL_CAPSULE | Freq: Three times a day (TID) | ORAL | 3 refills | Status: DC | PRN

## 2018-11-02 MED ORDER — MONTELUKAST SODIUM 10 MG PO TABS: 10.0000 mg | ORAL_TABLET | Freq: Every day | ORAL | 3 refills | Status: DC

## 2018-11-02 MED ORDER — AZITHROMYCIN 250 MG PO TABS: ORAL_TABLET | ORAL | 0 refills | Status: DC

## 2018-11-02 MED ORDER — AZITHROMYCIN 250 MG PO TABS: ORAL_TABLET | ORAL | 1 refills | Status: DC

## 2018-11-02 NOTE — Progress Notes (Signed)
BP 126/86   Pulse 65   Temp 99.2 F (37.3 C) (Oral)   Ht 5\' 7"  (1.702 m)   Wt 164 lb 9.6 oz (74.7 kg)   LMP 03/10/2013   BMI 25.78 kg/m    Subjective:    Patient ID: Michelle Stafford, female    DOB: 1966/02/11, 53 y.o.   MRN: [id number]  HPI: Michelle Stafford is a 53 y.o. female presenting on 11/02/2018 for Hoarse; Cough; and Sore Throat  Patient with several days of progressing upper respiratory and bronchial symptoms. Initially there was more upper respiratory congestion. This progressed to having significant cough that is productive throughout the day and severe at night. There is occasional wheezing after coughing. Sometimes there is slight dyspnea on exertion. It is productive mucus that is yellow in color. Denies any blood.   Past Medical History:  Diagnosis Date  . Anxiety   . Depression   . Seasonal allergies    Relevant past medical, surgical, family and social history reviewed and updated as indicated. Interim medical history since our last visit reviewed. Allergies and medications reviewed and updated. DATA REVIEWED: CHART IN EPIC  Family History reviewed for pertinent findings.  Review of Systems  Constitutional: Positive for chills and fatigue. Negative for activity change and appetite change.  HENT: Positive for congestion, postnasal drip and sore throat.   Eyes: Negative.   Respiratory: Positive for cough and wheezing.   Cardiovascular: Negative.  Negative for chest pain, palpitations and leg swelling.  Gastrointestinal: Negative.   Genitourinary: Negative.   Musculoskeletal: Negative.   Skin: Negative.   Neurological: Positive for headaches.    Allergies as of 11/02/2018      Reactions   Penicillins Hives      Medication List       Accurate as of November 02, 2018  7:09 PM. Always use your most recent med list.        azithromycin 250 MG tablet Commonly known as:  ZITHROMAX Z-PAK Take as directed   benzonatate 200 MG capsule Commonly known  as:  TESSALON Take 1 capsule (200 mg total) by mouth 3 (three) times daily as needed for cough.   citalopram 40 MG tablet Commonly known as:  CELEXA Take 1 tablet (40 mg total) by mouth daily.   Melatonin 10 MG Tabs Take by mouth.   montelukast 10 MG tablet Commonly known as:  SINGULAIR Take 1 tablet (10 mg total) by mouth at bedtime.   multivitamin tablet Take 1 tablet by mouth daily.   TRUBIOTICS PO Take by mouth daily.          Objective:    BP 126/86   Pulse 65   Temp 99.2 F (37.3 C) (Oral)   Ht 5\' 7"  (1.702 m)   Wt 164 lb 9.6 oz (74.7 kg)   LMP 03/10/2013   BMI 25.78 kg/m   Allergies  Allergen Reactions  . Penicillins Hives    Wt Readings from Last 3 Encounters:  11/02/18 164 lb 9.6 oz (74.7 kg)  10/10/18 166 lb (75.3 kg)  08/15/18 167 lb (75.8 kg)    Physical Exam Constitutional:      Appearance: She is well-developed.  HENT:     Head: Normocephalic and atraumatic.     Right Ear: Drainage and tenderness present.     Left Ear: Drainage and tenderness present.     Nose: Mucosal edema and rhinorrhea present.     Right Sinus: No maxillary sinus tenderness or frontal sinus tenderness.  Left Sinus: No maxillary sinus tenderness or frontal sinus tenderness.     Mouth/Throat:     Pharynx: Oropharyngeal exudate and posterior oropharyngeal erythema present.  Eyes:     Conjunctiva/sclera: Conjunctivae normal.     Pupils: Pupils are equal, round, and reactive to light.  Neck:     Musculoskeletal: Normal range of motion and neck supple.  Cardiovascular:     Rate and Rhythm: Normal rate and regular rhythm.     Heart sounds: Normal heart sounds.  Pulmonary:     Effort: Pulmonary effort is normal.     Breath sounds: Examination of the right-upper field reveals wheezing. Examination of the left-upper field reveals wheezing. Wheezing present.  Abdominal:     General: Bowel sounds are normal.     Palpations: Abdomen is soft.  Skin:    General: Skin is  warm and dry.     Findings: No rash.  Neurological:     Mental Status: She is alert and oriented to person, place, and time.     Deep Tendon Reflexes: Reflexes are normal and symmetric.  Psychiatric:        Behavior: Behavior normal.        Thought Content: Thought content normal.        Judgment: Judgment normal.     Results for orders placed or performed in visit on 10/17/18  [facility] MAMMOGRAPHY  Result Value Ref Range   HM Mammogram 0-4 Bi-Rad 0-4 Bi-Rad, Self Reported Normal      Assessment & Plan:   1. Acute non-recurrent frontal sinusitis - azithromycin (ZITHROMAX Z-PAK) 250 MG tablet; Take as directed  Dispense: 6 each; Refill: 1  2. Bronchitis - benzonatate (TESSALON) 200 MG capsule; Take 1 capsule (200 mg total) by mouth 3 (three) times daily as needed for cough.  Dispense: 90 capsule; Refill: 3 - azithromycin (ZITHROMAX Z-PAK) 250 MG tablet; Take as directed  Dispense: 6 each; Refill: 1   Continue all other maintenance medications as listed above.  Follow up plan: No follow-ups on file.  Educational handout given for Holly PA-C [facility] [address]  [place], [place] [address] [phone]   11/02/2018, 7:09 PM

## 2018-11-02 NOTE — Addendum Note (Signed)
Addended by: Earlene Plater on: 11/02/2018 08:25 AM   Modules accepted: Orders

## 2019-01-25 ENCOUNTER — Other Ambulatory Visit: Payer: Self-pay | Admitting: Physician Assistant

## 2019-07-25 ENCOUNTER — Other Ambulatory Visit: Payer: Self-pay | Admitting: Physician Assistant

## 2019-08-11 ENCOUNTER — Other Ambulatory Visit: Payer: Self-pay

## 2019-08-11 DIAGNOSIS — Z20822 Contact with and (suspected) exposure to covid-19: Secondary | ICD-10-CM

## 2019-08-12 LAB — NOVEL CORONAVIRUS, NAA: SARS-CoV-2, NAA: NOT DETECTED

## 2019-10-30 ENCOUNTER — Other Ambulatory Visit: Payer: Self-pay | Admitting: *Deleted

## 2019-10-30 DIAGNOSIS — Z780 Asymptomatic menopausal state: Secondary | ICD-10-CM

## 2019-11-02 ENCOUNTER — Telehealth: Payer: Self-pay | Admitting: Physician Assistant

## 2019-11-02 DIAGNOSIS — Z780 Asymptomatic menopausal state: Secondary | ICD-10-CM

## 2019-11-02 MED ORDER — CITALOPRAM HYDROBROMIDE 40 MG PO TABS: 40.0000 mg | ORAL_TABLET | Freq: Every day | ORAL | 0 refills | Status: DC

## 2019-11-02 NOTE — Telephone Encounter (Signed)
Sent 1 rf in to last until appt

## 2019-11-02 NOTE — Telephone Encounter (Signed)
What is the name of the medication? citalopram (CELEXA) 40 MG tablet  Have you contacted your pharmacy to request a refill? Yes, but refused because she needs an apt. AJ next available CPE is 11/30/2019  Which pharmacy would you like this sent to? [facility]   Patient notified that their request is being sent to the clinical staff for review and that they should receive a call once it is complete. If they do not receive a call within 24 hours they can check with their pharmacy or our office.

## 2019-11-30 ENCOUNTER — Ambulatory Visit: Payer: BLUE CROSS/BLUE SHIELD | Admitting: Physician Assistant

## 2020-01-01 ENCOUNTER — Encounter: Payer: Self-pay | Admitting: Family Medicine

## 2020-01-09 ENCOUNTER — Ambulatory Visit: Payer: BLUE CROSS/BLUE SHIELD | Admitting: Physician Assistant

## 2020-01-09 ENCOUNTER — Other Ambulatory Visit: Payer: Self-pay

## 2020-01-09 ENCOUNTER — Ambulatory Visit (INDEPENDENT_AMBULATORY_CARE_PROVIDER_SITE_OTHER): Payer: BC Managed Care – PPO | Admitting: Family Medicine

## 2020-01-09 ENCOUNTER — Encounter: Payer: Self-pay | Admitting: Family Medicine

## 2020-01-09 VITALS — BP 124/76 | HR 77 | Temp 97.2°F | Ht 67.0 in | Wt 170.0 lb

## 2020-01-09 DIAGNOSIS — Z124 Encounter for screening for malignant neoplasm of cervix: Secondary | ICD-10-CM

## 2020-01-09 DIAGNOSIS — Z1231 Encounter for screening mammogram for malignant neoplasm of breast: Secondary | ICD-10-CM | POA: Diagnosis not present

## 2020-01-09 DIAGNOSIS — Z01419 Encounter for gynecological examination (general) (routine) without abnormal findings: Secondary | ICD-10-CM

## 2020-01-09 DIAGNOSIS — R5383 Other fatigue: Secondary | ICD-10-CM

## 2020-01-09 DIAGNOSIS — Z01411 Encounter for gynecological examination (general) (routine) with abnormal findings: Secondary | ICD-10-CM | POA: Diagnosis not present

## 2020-01-09 DIAGNOSIS — F419 Anxiety disorder, unspecified: Secondary | ICD-10-CM

## 2020-01-09 DIAGNOSIS — E782 Mixed hyperlipidemia: Secondary | ICD-10-CM | POA: Diagnosis not present

## 2020-01-09 DIAGNOSIS — Z7689 Persons encountering health services in other specified circumstances: Secondary | ICD-10-CM

## 2020-01-09 DIAGNOSIS — Z114 Encounter for screening for human immunodeficiency virus [HIV]: Secondary | ICD-10-CM

## 2020-01-09 DIAGNOSIS — Z780 Asymptomatic menopausal state: Secondary | ICD-10-CM

## 2020-01-09 MED ORDER — CITALOPRAM HYDROBROMIDE 40 MG PO TABS: 40.0000 mg | ORAL_TABLET | Freq: Every day | ORAL | 3 refills | Status: DC

## 2020-01-09 MED ORDER — MONTELUKAST SODIUM 10 MG PO TABS: ORAL_TABLET | ORAL | 3 refills | Status: DC

## 2020-01-09 NOTE — Progress Notes (Signed)
Michelle Stafford is a 54 y.o. female presents to office today for annual physical exam examination.    Concerns today include: 1. none  Occupation: Buyer, retail, Marital status: married, Substance use: none Diet: some bojangles but otherwise lean meats and veggies, Exercise: active at work but no structured exercise.  Enjoys playing with her 42-year-old grandson Last eye exam: Scheduled Last dental exam: Up-to-date Last colonoscopy: Up-to-date Last mammogram: Had done today Last pap smear: Needs Refills needed today: Singulair and Celexa Immunizations needed: Up-to-date  Past Medical History:  Diagnosis Date  . Anxiety   . Depression   . Seasonal allergies    Social History   Socioeconomic History  . Marital status: Married    Spouse name: Not on file  . Number of children: Not on file  . Years of education: Not on file  . Highest education level: Not on file  Occupational History  . Not on file  Tobacco Use  . Smoking status: Former Smoker    Quit date: 09/04/2012    Years since quitting: 7.3  . Smokeless tobacco: Never Used  Substance and Sexual Activity  . Alcohol use: Yes    Alcohol/week: 16.0 standard drinks    Types: 16 Cans of beer per week  . Drug use: No  . Sexual activity: Not on file  Other Topics Concern  . Not on file  Social History Narrative  . Not on file   Social Determinants of Health   Financial Resource Strain:   . Difficulty of Paying Living Expenses:   Food Insecurity:   . Worried About Charity fundraiser in the Last Year:   . Arboriculturist in the Last Year:   Transportation Needs:   . Film/video editor (Medical):   Marland Kitchen Lack of Transportation (Non-Medical):   Physical Activity:   . Days of Exercise per Week:   . Minutes of Exercise per Session:   Stress:   . Feeling of Stress :   Social Connections:   . Frequency of Communication with Friends and Family:   . Frequency of Social Gatherings with Friends and Family:   . Attends  Religious Services:   . Active Member of Clubs or Organizations:   . Attends Archivist Meetings:   Marland Kitchen Marital Status:   Intimate Partner Violence:   . Fear of Current or Ex-Partner:   . Emotionally Abused:   Marland Kitchen Physically Abused:   . Sexually Abused:    Past Surgical History:  Procedure Laterality Date  . COLONOSCOPY    . TUBAL LIGATION  1987   Family History  Problem Relation Age of Onset  . Cancer Father        stomach  . Stomach cancer Father   . COPD Brother   . Rectal cancer Cousin   . Colon cancer Neg Hx   . Esophageal cancer Neg Hx     Current Outpatient Medications:  .  citalopram (CELEXA) 40 MG tablet, Take 1 tablet (40 mg total) by mouth daily., Disp: 90 tablet, Rfl: 0 .  Melatonin 10 MG TABS, Take by mouth., Disp: , Rfl:  .  montelukast (SINGULAIR) 10 MG tablet, TAKE 1 TABLET BY MOUTH EVERYDAY AT BEDTIME, Disp: 90 tablet, Rfl: 1 .  Multiple Vitamin (MULTIVITAMIN) tablet, Take 1 tablet by mouth daily., Disp: , Rfl:  .  Probiotic Product (TRUBIOTICS PO), Take by mouth daily., Disp: , Rfl:   Allergies  Allergen Reactions  . Penicillins Hives  ROS: Review of Systems Constitutional: negative Eyes: positive for contacts/glasses Ears, nose, mouth, throat, and face: negative Respiratory: negative Cardiovascular: negative Gastrointestinal: negative Genitourinary:negative Integument/breast: negative Hematologic/lymphatic: negative Musculoskeletal:Some burning sensation of the bottoms of both feet Neurological: negative Behavioral/Psych: negative Endocrine: Fatigue Allergic/Immunologic: negative    Physical exam BP 124/76   Pulse 77   Temp (!) 97.2 F (36.2 C) (Temporal)   Ht '5\' 7"'$  (1.702 m)   Wt 170 lb (77.1 kg)   LMP 03/10/2013   SpO2 97%   BMI 26.63 kg/m  General appearance: alert, cooperative, appears stated age and no distress Head: Normocephalic, without obvious abnormality, atraumatic Eyes: negative findings: lids and lashes  normal, conjunctivae and sclerae normal, corneas clear and pupils equal, round, reactive to light and accomodation Ears: normal TM's and external ear canals both ears Nose: Deviated septum to the left.  No drainage.  No polyps. Throat: lips, mucosa, and tongue normal; teeth and gums normal Neck: no adenopathy, no carotid bruit, no JVD, supple, symmetrical, trachea midline and thyroid not enlarged, symmetric, no tenderness/mass/nodules Back: symmetric, no curvature. ROM normal. No CVA tenderness. Lungs: clear to auscultation bilaterally Heart: regular rate and rhythm, S1, S2 normal, no murmur, click, rub or gallop Abdomen: soft, non-tender; bowel sounds normal; no masses,  no organomegaly Pelvic: cervix normal in appearance, external genitalia normal, no adnexal masses or tenderness, no cervical motion tenderness, rectovaginal septum normal, uterus normal size, shape, and consistency and Mild vaginal discharge noted Extremities: extremities normal, atraumatic, no cyanosis or edema Pulses: 2+ and symmetric Skin: Slight hyper keratosis pilaris noted in the thigh area Lymph nodes: Cervical, supraclavicular, and axillary nodes normal. Neurologic: Alert and oriented X 3, normal strength and tone. Normal symmetric reflexes. Normal coordination and gait Psych: Mood stable, speech normal, affect appropriate, pleasant and interactive  Depression screen [facility] 2/9 01/09/2020 11/02/2018 10/10/2018  Decreased Interest 0 0 0  Down, Depressed, Hopeless 0 0 0  PHQ - 2 Score 0 0 0  Altered sleeping 0 - -  Tired, decreased energy 0 - -  Change in appetite 0 - -  Feeling bad or failure about yourself  0 - -  Trouble concentrating 0 - -  Moving slowly or fidgety/restless 0 - -  Suicidal thoughts 0 - -  PHQ-9 Score 0 - -   GAD 7 : Generalized Anxiety Score 01/09/2020  Nervous, Anxious, on Edge 0  Control/stop worrying 0  Worry too much - different things 0  Trouble relaxing 0  Restless 0  Easily annoyed or  irritable 0  Afraid - awful might happen 0  Total GAD 7 Score 0    Assessment/ Plan: Michelle Stafford here for annual physical exam.   1. Well woman exam with routine gynecological exam - IGP, Aptima HPV  2. Screening for malignant neoplasm of cervix - IGP, Aptima HPV  3. Anxiety Well-controlled with Celexa  4. Establishing care with new doctor, encounter for   5. Mixed hyperlipidemia - CMP14+EGFR; Future - Lipid panel; Future - TSH; Future  6.  Fatigue - CBC; Future  7. Screening for HIV without presence of risk factors - HIV antibody (with reflex); Future  8. Menopause Went through menopause at age 55.  Plan for DEXA soon. - citalopram (CELEXA) 40 MG tablet; Take 1 tablet (40 mg total) by mouth daily.  Dispense: 90 tablet; Refill: 3   Counseled on healthy lifestyle choices, including diet (rich in fruits, vegetables and lean meats and low in salt and simple carbohydrates) and exercise (  at least 30 minutes of moderate physical activity daily).  Patient to follow up in 1 year for annual exam or sooner if needed.  Michelle Stafford M. Lajuana Ripple, DO

## 2020-01-09 NOTE — Patient Instructions (Addendum)
Come in for fasting labs at your convenience.  You had labs performed today.  You will be contacted with the results of the labs once they are available, usually in the next 3 business days for routine lab work.  If you have an active my chart account, they will be released to your MyChart.  If you prefer to have these labs released to you via telephone, please let us know.  If you had a pap smear or biopsy performed, expect to be contacted in about 7-10 days.   Preventive Care 37-27 Years Old, Female Preventive care refers to visits with your health care provider and lifestyle choices that can promote health and wellness. This includes:  A yearly physical exam. This may also be called an annual well check.  Regular dental visits and eye exams.  Immunizations.  Screening for certain conditions.  Healthy lifestyle choices, such as eating a healthy diet, getting regular exercise, not using drugs or products that contain nicotine and tobacco, and limiting alcohol use. What can I expect for my preventive care visit? Physical exam Your health care provider will check your:  Height and weight. This may be used to calculate body mass index (BMI), which tells if you are at a healthy weight.  Heart rate and blood pressure.  Skin for abnormal spots. Counseling Your health care provider may ask you questions about your:  Alcohol, tobacco, and drug use.  Emotional well-being.  Home and relationship well-being.  Sexual activity.  Eating habits.  Work and work Statistician.  Method of birth control.  Menstrual cycle.  Pregnancy history. What immunizations do I need?  Influenza (flu) vaccine  This is recommended every year. Tetanus, diphtheria, and pertussis (Tdap) vaccine  You may need a Td booster every 10 years. Varicella (chickenpox) vaccine  You may need this if you have not been vaccinated. Zoster (shingles) vaccine  You may need this after age 30. Measles, mumps,  and rubella (MMR) vaccine  You may need at least one dose of MMR if you were born in 1957 or later. You may also need a second dose. Pneumococcal conjugate (PCV13) vaccine  You may need this if you have certain conditions and were not previously vaccinated. Pneumococcal polysaccharide (PPSV23) vaccine  You may need one or two doses if you smoke cigarettes or if you have certain conditions. Meningococcal conjugate (MenACWY) vaccine  You may need this if you have certain conditions. Hepatitis A vaccine  You may need this if you have certain conditions or if you travel or work in places where you may be exposed to hepatitis A. Hepatitis B vaccine  You may need this if you have certain conditions or if you travel or work in places where you may be exposed to hepatitis B. Haemophilus influenzae type b (Hib) vaccine  You may need this if you have certain conditions. Human papillomavirus (HPV) vaccine  If recommended by your health care provider, you may need three doses over 6 months. You may receive vaccines as individual doses or as more than one vaccine together in one shot (combination vaccines). Talk with your health care provider about the risks and benefits of combination vaccines. What tests do I need? Blood tests  Lipid and cholesterol levels. These may be checked every 5 years, or more frequently if you are over 73 years old.  Hepatitis C test.  Hepatitis B test. Screening  Lung cancer screening. You may have this screening every year starting at age 48 if you have  a 30-pack-year history of smoking and currently smoke or have quit within the past 15 years.  Colorectal cancer screening. All adults should have this screening starting at age 71 and continuing until age 68. Your health care provider may recommend screening at age 64 if you are at increased risk. You will have tests every 1-10 years, depending on your results and the type of screening test.  Diabetes screening.  This is done by checking your blood sugar (glucose) after you have not eaten for a while (fasting). You may have this done every 1-3 years.  Mammogram. This may be done every 1-2 years. Talk with your health care provider about when you should start having regular mammograms. This may depend on whether you have a family history of breast cancer.  BRCA-related cancer screening. This may be done if you have a family history of breast, ovarian, tubal, or peritoneal cancers.  Pelvic exam and Pap test. This may be done every 3 years starting at age 72. Starting at age 37, this may be done every 5 years if you have a Pap test in combination with an HPV test. Other tests  Sexually transmitted disease (STD) testing.  Bone density scan. This is done to screen for osteoporosis. You may have this scan if you are at high risk for osteoporosis. Follow these instructions at home: Eating and drinking  Eat a diet that includes fresh fruits and vegetables, whole grains, lean protein, and low-fat dairy.  Take vitamin and mineral supplements as recommended by your health care provider.  Do not drink alcohol if: ? Your health care provider tells you not to drink. ? You are pregnant, may be pregnant, or are planning to become pregnant.  If you drink alcohol: ? Limit how much you have to 0-1 drink a day. ? Be aware of how much alcohol is in your drink. In the [place] one drink equals one 12 oz bottle of beer (355 mL), one 5 oz glass of wine (148 mL), or one 1 oz glass of hard liquor (44 mL). Lifestyle  Take daily care of your teeth and gums.  Stay active. Exercise for at least 30 minutes on 5 or more days each week.  Do not use any products that contain nicotine or tobacco, such as cigarettes, e-cigarettes, and chewing tobacco. If you need help quitting, ask your health care provider.  If you are sexually active, practice safe sex. Use a condom or other form of birth control (contraception) in order to  prevent pregnancy and STIs (sexually transmitted infections).  If told by your health care provider, take low-dose aspirin daily starting at age 42. What's next?  Visit your health care provider once a year for a well check visit.  Ask your health care provider how often you should have your eyes and teeth checked.  Stay up to date on all vaccines. This information is not intended to replace advice given to you by your health care provider. Make sure you discuss any questions you have with your health care provider. Document Revised: 06/02/2018 Document Reviewed: 06/02/2018 Elsevier Patient Education  2020 [employer].

## 2020-01-10 ENCOUNTER — Other Ambulatory Visit: Payer: Self-pay

## 2020-01-10 ENCOUNTER — Other Ambulatory Visit: Payer: BC Managed Care – PPO

## 2020-01-10 DIAGNOSIS — R5383 Other fatigue: Secondary | ICD-10-CM | POA: Diagnosis not present

## 2020-01-10 DIAGNOSIS — Z114 Encounter for screening for human immunodeficiency virus [HIV]: Secondary | ICD-10-CM | POA: Diagnosis not present

## 2020-01-10 DIAGNOSIS — E782 Mixed hyperlipidemia: Secondary | ICD-10-CM | POA: Diagnosis not present

## 2020-01-11 LAB — CMP14+EGFR
ALT: 14 IU/L (ref 0–32)
AST: 16 IU/L (ref 0–40)
Albumin/Globulin Ratio: 1.5 (ref 1.2–2.2)
Albumin: 4.3 g/dL (ref 3.8–4.9)
Alkaline Phosphatase: 84 IU/L (ref 39–117)
BUN/Creatinine Ratio: 13 (ref 9–23)
BUN: 10 mg/dL (ref 6–24)
Bilirubin Total: 0.6 mg/dL (ref 0.0–1.2)
CO2: 20 mmol/L (ref 20–29)
Calcium: 9 mg/dL (ref 8.7–10.2)
Chloride: 103 mmol/L (ref 96–106)
Creatinine, Ser: 0.76 mg/dL (ref 0.57–1.00)
GFR calc Af Amer: 102 mL/min/{1.73_m2} (ref >59)
GFR calc non Af Amer: 89 mL/min/{1.73_m2} (ref >59)
Globulin, Total: 2.8 g/dL (ref 1.5–4.5)
Glucose: 108 mg/dL — ABNORMAL HIGH (ref 65–99)
Potassium: 4.1 mmol/L (ref 3.5–5.2)
Sodium: 140 mmol/L (ref 134–144)
Total Protein: 7.1 g/dL (ref 6.0–8.5)

## 2020-01-11 LAB — CBC
Hematocrit: 39.4 % (ref 34.0–46.6)
Hemoglobin: 13.1 g/dL (ref 11.1–15.9)
MCH: 30.3 pg (ref 26.6–33.0)
MCHC: 33.2 g/dL (ref 31.5–35.7)
MCV: 91 fL (ref 79–97)
Platelets: 397 10*3/uL (ref 150–450)
RBC: 4.32 x10E6/uL (ref 3.77–5.28)
RDW: 12 % (ref 11.7–15.4)
WBC: 6.8 10*3/uL (ref 3.4–10.8)

## 2020-01-11 LAB — LIPID PANEL
Chol/HDL Ratio: 5.8 ratio — ABNORMAL HIGH (ref 0.0–4.4)
Cholesterol, Total: 245 mg/dL — ABNORMAL HIGH (ref 100–199)
HDL: 42 mg/dL (ref >39)
LDL Chol Calc (NIH): 145 mg/dL — ABNORMAL HIGH (ref 0–99)
Triglycerides: 319 mg/dL — ABNORMAL HIGH (ref 0–149)
VLDL Cholesterol Cal: 58 mg/dL — ABNORMAL HIGH (ref 5–40)

## 2020-01-11 LAB — IGP, APTIMA HPV: HPV Aptima: NEGATIVE

## 2020-01-11 LAB — HIV ANTIBODY (ROUTINE TESTING W REFLEX): HIV Screen 4th Generation wRfx: NONREACTIVE

## 2020-01-11 LAB — TSH: TSH: 1.71 u[IU]/mL (ref 0.450–4.500)

## 2020-01-11 LAB — VITAMIN B12: Vitamin B-12: 498 pg/mL (ref 232–1245)

## 2020-01-16 ENCOUNTER — Encounter: Payer: Self-pay | Admitting: Family Medicine

## 2021-01-10 ENCOUNTER — Encounter: Payer: Self-pay | Admitting: Internal Medicine

## 2021-01-25 ENCOUNTER — Other Ambulatory Visit: Payer: Self-pay | Admitting: Family Medicine

## 2021-01-25 DIAGNOSIS — Z780 Asymptomatic menopausal state: Secondary | ICD-10-CM

## 2021-01-26 ENCOUNTER — Other Ambulatory Visit: Payer: Self-pay | Admitting: Family Medicine

## 2021-01-29 ENCOUNTER — Encounter: Payer: Self-pay | Admitting: Family Medicine

## 2021-01-29 ENCOUNTER — Other Ambulatory Visit: Payer: Self-pay

## 2021-01-29 ENCOUNTER — Ambulatory Visit (INDEPENDENT_AMBULATORY_CARE_PROVIDER_SITE_OTHER): Payer: BC Managed Care – PPO | Admitting: Family Medicine

## 2021-01-29 VITALS — BP 125/66 | HR 83 | Temp 97.6°F | Ht 67.0 in | Wt 168.0 lb

## 2021-01-29 DIAGNOSIS — H04123 Dry eye syndrome of bilateral lacrimal glands: Secondary | ICD-10-CM | POA: Diagnosis not present

## 2021-01-29 DIAGNOSIS — M7702 Medial epicondylitis, left elbow: Secondary | ICD-10-CM

## 2021-01-29 DIAGNOSIS — K635 Polyp of colon: Secondary | ICD-10-CM | POA: Diagnosis not present

## 2021-01-29 DIAGNOSIS — R739 Hyperglycemia, unspecified: Secondary | ICD-10-CM

## 2021-01-29 DIAGNOSIS — Z0000 Encounter for general adult medical examination without abnormal findings: Secondary | ICD-10-CM

## 2021-01-29 DIAGNOSIS — E782 Mixed hyperlipidemia: Secondary | ICD-10-CM | POA: Diagnosis not present

## 2021-01-29 DIAGNOSIS — F419 Anxiety disorder, unspecified: Secondary | ICD-10-CM

## 2021-01-29 DIAGNOSIS — M6740 Ganglion, unspecified site: Secondary | ICD-10-CM

## 2021-01-29 DIAGNOSIS — H40033 Anatomical narrow angle, bilateral: Secondary | ICD-10-CM | POA: Diagnosis not present

## 2021-01-29 DIAGNOSIS — Z0001 Encounter for general adult medical examination with abnormal findings: Secondary | ICD-10-CM | POA: Diagnosis not present

## 2021-01-29 DIAGNOSIS — M7062 Trochanteric bursitis, left hip: Secondary | ICD-10-CM

## 2021-01-29 DIAGNOSIS — Z780 Asymptomatic menopausal state: Secondary | ICD-10-CM

## 2021-01-29 LAB — BAYER DCA HB A1C WAIVED: HB A1C (BAYER DCA - WAIVED): 5.5 % (ref <7.0)

## 2021-01-29 MED ORDER — BUSPIRONE HCL 5 MG PO TABS: ORAL_TABLET | ORAL | 0 refills | Status: DC

## 2021-01-29 MED ORDER — CITALOPRAM HYDROBROMIDE 40 MG PO TABS: 40.0000 mg | ORAL_TABLET | Freq: Every day | ORAL | 3 refills | Status: DC

## 2021-01-29 NOTE — Progress Notes (Signed)
Michelle Stafford is a 55 y.o. female presents to office today for annual physical exam examination.    Concerns today include: 1.  Left elbow pain, left hip pain, left knee pain and cyst Patient reports that she has had a couple month history of a knot on the left index finger.  Initially it was tender but since has become normal again.  She works frequently with her hands.  She is also had some associated left medial elbow pain.  Symptoms seem to be worse with certain movements.  She does not report any weakness or sensory changes.  She has had a left-sided hip pain and points to the anterior hip and states that it radiates down into the left thigh and knee.  She has had no falls or preceding injury.  Not taking any medication orally but notes that she has been taking some CBD drops and this seems to be helping.  2.  Tension Patient reports that she feels unable to relax.  She often gets tension in her upper back.  She takes Celexa but this does not seem to control her ruminating thoughts.  While she is able to fall asleep without difficulty, if she wakes up in the melanite she often has difficulty getting back to sleep due to not being able to shut her mind off  Occupation: Works in [employer], educational, Marital status: Married, Substance use: Occasional social alcohol use Diet: fair, Exercise: no structured Last eye exam: UTD Last dental exam: UTD Last colonoscopy: Needs.  Last colonoscopy with polyp.  She is aware to call and schedule Last mammogram: Needs to schedule with [place] Last pap smear: Up-to-date, normal in 2021 Refills needed today: Celexa Immunizations needed: Immunization History  Administered Date(s) Administered  . Tdap 04/30/2014     Past Medical History:  Diagnosis Date  . Anxiety   . Depression   . Seasonal allergies    Social History   Socioeconomic History  . Marital status: Married    Spouse name: Not on file  . Number of children: Not on file  . Years of  education: Not on file  . Highest education level: Not on file  Occupational History  . Not on file  Tobacco Use  . Smoking status: Former Smoker    Quit date: 09/04/2012    Years since quitting: 8.4  . Smokeless tobacco: Never Used  Vaping Use  . Vaping Use: Never used  Substance and Sexual Activity  . Alcohol use: Yes    Alcohol/week: 16.0 standard drinks    Types: 16 Cans of beer per week  . Drug use: No  . Sexual activity: Yes    Birth control/protection: None  Other Topics Concern  . Not on file  Social History Narrative  . Not on file   Social Determinants of Health   Financial Resource Strain: Not on file  Food Insecurity: Not on file  Transportation Needs: Not on file  Physical Activity: Not on file  Stress: Not on file  Social Connections: Not on file  Intimate Partner Violence: Not on file   Past Surgical History:  Procedure Laterality Date  . COLONOSCOPY    . TUBAL LIGATION  1987   Family History  Problem Relation Age of Onset  . Cancer Father        stomach  . Stomach cancer Father   . COPD Brother   . Rectal cancer Cousin   . Colon cancer Neg Hx   . Esophageal cancer Neg Hx  Current Outpatient Medications:  .  citalopram (CELEXA) 40 MG tablet, TAKE 1 TABLET BY MOUTH EVERY DAY, Disp: 90 tablet, Rfl: 0 .  Melatonin 10 MG TABS, Take by mouth., Disp: , Rfl:  .  montelukast (SINGULAIR) 10 MG tablet, TAKE 1 TABLET BY MOUTH EVERYDAY AT BEDTIME, Disp: 90 tablet, Rfl: 1 .  Multiple Vitamin (MULTIVITAMIN) tablet, Take 1 tablet by mouth daily., Disp: , Rfl:  .  Probiotic Product (TRUBIOTICS PO), Take by mouth daily., Disp: , Rfl:   Allergies  Allergen Reactions  . Penicillins Hives     ROS: Review of Systems Pertinent items noted in HPI and remainder of comprehensive ROS otherwise negative.    Physical exam BP 125/66   Pulse 83   Temp 97.6 F (36.4 C)   Ht _0  (1.702 m)   Wt 168 lb (76.2 kg)   LMP 03/10/2013   SpO2 97%   BMI 26.31 kg/m   General appearance: alert, cooperative, appears stated age and no distress Head: Normocephalic, without obvious abnormality, atraumatic Eyes: negative findings: lids and lashes normal, conjunctivae and sclerae normal, corneas clear and Pupils are equal and round but are dilated (patient just had an eye exam with her ophthalmologist) Ears: normal TM's and external ear canals both ears Nose: Nares normal. Septum midline. Mucosa normal. No drainage or sinus tenderness. Throat: lips, mucosa, and tongue normal; teeth and gums normal Neck: no adenopathy, supple, symmetrical, trachea midline and thyroid not enlarged, symmetric, no tenderness/mass/nodules Back: symmetric, no curvature. ROM normal. No CVA tenderness., Negative straight leg raise.  Negative FADIR; negative FABER; tenderness to the left greater trochanteric bursa present Lungs: clear to auscultation bilaterally Heart: regular rate and rhythm, S1, S2 normal, no murmur, click, rub or gallop Abdomen: soft, non-tender; bowel sounds normal; no masses,  no organomegaly Extremities: extremities normal, atraumatic, no cyanosis or edema Pulses: 2+ and symmetric Skin: Skin color, texture, turgor normal. No rashes or lesions Lymph nodes: Cervical, supraclavicular, and axillary nodes normal. Neurologic: Alert and oriented X 3, normal strength and tone. Normal symmetric reflexes. Normal coordination and gait Psych: Patient is pleasant and interactive.  Does not appear to be responding to internal stimuli Joints: Small ganglion cyst noted at the DIP of the left index finger, she has similar along the distal radial aspect of the left wrist and again noted at the DIP of the right pinky.  Depression screen [facility] 2/9 01/29/2021 01/09/2020 11/02/2018  Decreased Interest 0 0 0  Down, Depressed, Hopeless 0 0 0  PHQ - 2 Score 0 0 0  Altered sleeping - 0 -  Tired, decreased energy - 0 -  Change in appetite - 0 -  Feeling bad or failure about yourself  - 0 -   Trouble concentrating - 0 -  Moving slowly or fidgety/restless - 0 -  Suicidal thoughts - 0 -  PHQ-9 Score - 0 -   GAD 7 : Generalized Anxiety Score 01/09/2020  Nervous, Anxious, on Edge 0  Control/stop worrying 0  Worry too much - different things 0  Trouble relaxing 0  Restless 0  Easily annoyed or irritable 0  Afraid - awful might happen 0  Total GAD 7 Score 0   Assessment/ Plan: Aram Beecham here for annual physical exam.   Annual physical exam  Mixed hyperlipidemia - Plan: CMP14+EGFR, Lipid Panel, TSH  Anxiety - Plan: busPIRone (BUSPAR) 5 MG tablet  Polyp of colon, unspecified part of colon, unspecified type - Plan: CBC  Elevated serum glucose -  Plan: Bayer DCA Hb A1c Waived  Trochanteric bursitis of left hip  Ganglion cyst  Medial epicondylitis of left elbow  Menopause - Plan: citalopram (CELEXA) 40 MG tablet  She will schedule mammogram and colonoscopy.  Fasting lipid, TSH, CMP all ordered.  Buspirone added for what sounds like anxiety.  Somewhat confounding as her scores were negative.  Continue Celexa  Reinforced need to schedule appointment with GI.  This printed letter was given again today  No evidence of diabetes.  Lesion on hand is consistent with ganglion cyst.  She has similar on the left wrist as well as right pinky finger.  We discussed reasons for referral  Advised to use a sports band for the medial epicondylitis.  Oral NSAID and icing recommended  I suspect that the issues she is having in the left hip is actually bursitis.  Intra-articular testing was negative today.  Supportive care as above.  Welcome to come back if symptoms get worse.  Would be glad to do a corticosteroid injection to this bursa  Patient to follow up in 4 weeks for f/u GAD  Kwasi Joung M. Lajuana Ripple, DO

## 2021-01-29 NOTE — Patient Instructions (Signed)
Due for colonoscopy.  Please call Dr perry's office to schedule.  You had labs performed today.  You will be contacted with the results of the labs once they are available, usually in the next 3 business days for routine lab work.  If you have an active my chart account, they will be released to your MyChart.  If you prefer to have these labs released to you via telephone, please let us know.   [email or web address]  Ganglion Cyst  A ganglion cyst is a non-cancerous, fluid-filled lump of tissue that occurs near a joint, tendon, or ligament. The cyst grows out of a joint or the lining of a tendon or ligament. Ganglion cysts most often develop in the hand or wrist, but they can also develop in the shoulder, elbow, hip, knee, ankle, or foot. Ganglion cysts are ball-shaped or egg-shaped. Their size can range from the size of a pea to larger than a grape. Increased activity may cause the cyst to get bigger because more fluid starts to build up. What are the causes? The exact cause of this condition is not known, but it may be related to:  Inflammation or irritation around the joint.  An injury or tear in the layers of tissue around the joint (joint capsule).  Repetitive movements or overuse.  History of acute or repeated injury. What increases the risk? You are more likely to develop this condition if:  You are a female.  You are 53-39 years old. What are the signs or symptoms? The main symptom of this condition is a lump. It most often appears on the hand or wrist. In many cases, there are no other symptoms, but a cyst can sometimes cause:  Tingling.  Pain or tenderness.  Numbness.  Weakness or loss of strength in the affected joint.  Decreased range of motion in the affected area of the body.   How is this diagnosed? Ganglion cysts are usually diagnosed based on a physical exam. Your health care provider will feel  the lump and may shine a light next to it. If it is a ganglion cyst, the light will likely shine through it. Your health care provider may order an X-ray, ultrasound, MRI, or CT scan to rule out other conditions. How is this treated? Ganglion cysts often go away on their own without treatment. If you have pain or other symptoms, treatment may be needed. Treatment is also needed if the ganglion cyst limits your movement or if it gets infected. Treatment may include:  Wearing a brace or splint on your wrist or finger.  Taking anti-inflammatory medicine.  Having fluid drained from the lump with a needle (aspiration).  Getting an injection of medicine into the joint to decrease inflammation. This may be corticosteroids, ethanol, or hyaluronidase.  Having surgery to remove the ganglion cyst.  Placing a pad in your shoe or wearing shoes that will not rub against the cyst if it is on your foot. Follow these instructions at home:  Do not press on the ganglion cyst, poke it with a needle, or hit it.  Take over-the-counter and prescription medicines only as told by your health care provider.  If you have a brace or splint: ? Wear it as told by your health care provider. ? Remove it as told by your health care provider. Ask if you need to remove it when you take a shower or a bath.  Watch your ganglion cyst for any changes.  Keep all follow-up visits as told  by your health care provider. This is important. Contact a health care provider if:  Your ganglion cyst becomes larger or more painful.  You have pus coming from the lump.  You have weakness or numbness in the affected area.  You have a fever or chills. Get help right away if:  You have a fever and have any of these in the cyst area: ? Increased redness. ? Red streaks. ? Swelling. Summary  A ganglion cyst is a non-cancerous, fluid-filled lump that occurs near a joint, tendon, or ligament.  Ganglion cysts most often develop in  the hand or wrist, but they can also develop in the shoulder, elbow, hip, knee, ankle, or foot.  Ganglion cysts often go away on their own without treatment. This information is not intended to replace advice given to you by your health care provider. Make sure you discuss any questions you have with your health care provider. Document Revised: 12/13/2019 Document Reviewed: 12/13/2019 Elsevier Patient Education  2021 [place].  Hip Bursitis  Hip bursitis is swelling of one or more fluid-filled sacs (bursae) in your hip joint. This condition can cause pain, and your symptoms may come and go over time. What are the causes?  Repeated use of your hip muscles.  Injury to the hip.  Weak butt muscles.  Bone spurs.  Infection. In some cases, the cause may not be known. What increases the risk? You are more likely to develop this condition if:  You had a past hip injury or hip surgery.  You have a condition, such as arthritis, gout, diabetes, or thyroid disease.  You have spine problems.  You have one leg that is shorter than the other.  You run a lot or do long-distance running.  You play sports where there is a risk of injury or falling, such as football, martial arts, or skiing. What are the signs or symptoms? Symptoms may come and go, and they often include:  Pain in the hip or groin area. Pain may get worse when you move your hip.  Tenderness and swelling of the hip. In rare cases, the bursa may become infected. If this happens, you may get a fever, as well as have warmth and redness in the hip area. How is this treated? This condition is treated by:  Resting your hip.  Icing your hip.  Wrapping the hip area with an elastic bandage (compression wrap).  Keeping the hip raised. Other treatments may include medicine, draining fluid out of the bursa, or using crutches, a cane, or a walker. Surgery may be needed, but this is rare. Long-term treatment may include  doing exercises to help your strength and flexibility. It may also include lifestyle changes like losing weight to lessen the strain on your hip. Follow these instructions at home: Managing pain, stiffness, and swelling  If told, put ice on the painful area. ? Put ice in a plastic bag. ? Place a towel between your skin and the bag. ? Leave the ice on for 20 minutes, 2-3 times a day.  Raise your hip by putting a pillow under your hips while you lie down. Stop if you feel pain.  If told, put heat on the affected area. Do this as often as told by your doctor. Use a moist heat pack or a heating pad as told by your doctor. ? Place a towel between your skin and the heat source. ? Leave the heat on for 20-30 minutes. ? Take off the heat if  your skin turns bright red. This is very important if you are unable to feel pain, heat, or cold. You may have a greater risk of getting burned.      Activity  Do not use your hip to support your body weight until your doctor says that you can.  Use crutches, a cane, or a walker as told by your doctor.  If the affected leg is one that you use to drive, ask your doctor if it is safe to drive.  Rest and protect your hip as much as you can until you feel better.  Return to your normal activities as told by your doctor. Ask your doctor what activities are safe for you.  Do exercises as told by your doctor. General instructions  Take over-the-counter and prescription medicines only as told by your doctor.  Gently rub and stretch your injured area as often as is comfortable.  Wear elastic bandages only as told by your doctor.  If one of your legs is shorter than the other, get fitted for a shoe insert or orthotic.  Keep a healthy weight. Follow instructions from your doctor.  Keep all follow-up visits as told by your doctor. This is important. How is this prevented?  Exercise regularly, as told by your doctor.  Wear the right shoes for the sport  you play.  Warm up and stretch before being active. Cool down and stretch after being active.  Take breaks often from repeated activity.  Avoid activities that bother your hip or cause pain.  Avoid sitting down for a long time. Where to find more information  American Academy of Orthopaedic Surgeons: orthoinfo.aaos.org Contact a doctor if:  You have a fever.  You have new symptoms.  You have trouble walking or doing everyday activities.  You have pain that gets worse or does not get better with medicine.  Your skin around your hip is red.  You get a feeling of warmth in your hip area. Get help right away if:  You cannot move your hip.  You have very bad pain.  You cannot control the muscles in your feet. Summary  Hip bursitis is swelling of one or more fluid-filled sacs (bursae) in your hip joint.  Symptoms often come and go over time.  This condition is often treated by resting and icing the hip. It also may help to keep the area raised and wrapped in an elastic bandage. Other treatments may be needed. This information is not intended to replace advice given to you by your health care provider. Make sure you discuss any questions you have with your health care provider. Document Revised: 07/24/2019 Document Reviewed: 05/30/2018 Elsevier Patient Education  2021 [place].  Golfer's Elbow  Golfer's elbow (medial epicondylitis) is a condition that results from inflammation of the strong bands of tissue (tendons) that attach your forearm muscles to the inside of your bone at the elbow. These tendons affect the muscles that bend the palm toward the wrist (flexion). The tendons become less flexible with age. This condition is called golfer's elbow because it is more common among people who constantly bend and twist their wrists, such as golfers. This injury is usually caused by repeated use of the same muscles. What are the causes? This condition is caused  by:  Repeatedly flexing, turning, or twisting your wrist.  Frequently gripping objects with your hands.  Sudden injury. What increases the risk? This condition is more likely to develop in people who play golf, baseball,  or tennis. This injury is more common among people who have jobs that require the constant use of their hands, such as:  People who use computers.  Carpenters.  Butchers.  Musicians. What are the signs or symptoms? This condition causes elbow pain that may spread to your forearm and upper arm. Symptoms of this condition include:  Pain at the inner elbow, forearm, or wrist.  A weak grip in the hand. The pain may get worse when you bend your wrist downward. How is this diagnosed? This condition is diagnosed based on your symptoms, your medical history, and a physical exam. During the exam, your health care provider may:  Test your grip strength.  Move your wrist to check for pain. You may also have an MRI to:  Confirm the diagnosis.  Look for other issues.  Check for tears in the ligaments, muscles, or tendons. How is this treated? Treatment for this condition includes:  Stopping all activities that make you bend or twist your elbow or wrist and waiting until your pain and other symptoms go away before resuming those activities.  Wearing an elbow brace or wrist splint to restrict the movements that cause symptoms.  Icing your inner elbow, forearm, or wrist to relieve pain.  Taking NSAIDs, such as ibuprofen, or getting corticosteroid injections to reduce pain and swelling.  Doing stretching, range-of-motion, and strengthening exercises (physical therapy) as told by your health care provider. In rare cases, surgery may be needed if your condition does not improve. Follow these instructions at home: If you have a brace or splint:  Wear the brace or splint as told by your health care provider. Remove it only as told by your health care  provider.  Check the skin around the brace or splint every day. Tell your health care provider about any concerns.  Loosen the brace or splint if your fingers tingle, become numb, or turn cold and blue.  Keep it clean.  If the brace or splint is not waterproof: ? Do not let it get wet. ? Cover it with a watertight covering when you take a bath or shower. Managing pain, stiffness, and swelling  If directed, put ice on the injured area. To do this: ? If you have a removable brace or splint, remove it as told by your health care provider. ? Put ice in a plastic bag. ? Place a towel between your skin and the bag. ? Leave the ice on for 20 minutes, 2-3 times a day. ? Remove the ice if your skin turns bright red. This is very important. If you cannot feel pain, heat, or cold, you have a greater risk of damage to the area.  Move your fingers often to avoid stiffness and swelling.   Activity  Rest your injured area as told by your health care provider.  Return to your normal activities as told by your health care provider. Ask your health care provider what activities are safe for you.  Do exercises as told by your health care provider. Lifestyle  If your condition is caused by sports, work with a trainer to make sure that you: ? Use the correct technique. ? Use the proper equipment.  If your condition is work related, talk with your employer about ways to manage your condition at work. General instructions  Take over-the-counter and prescription medicines only as told by your health care provider.  Do not use any products that contain nicotine or tobacco. These products include cigarettes, chewing tobacco, and  vaping devices, such as e-cigarettes. If you need help quitting, ask your health care provider.  Keep all follow-up visits. This is important. How is this prevented?  Before and after activity: ? Warm up and stretch before being active. ? Cool down and stretch after being  active. ? Give your body time to rest between periods of activity.  During activity: ? Make sure to use equipment that fits you. ? If you play golf, slow your golf swing to reduce shock in the arm when making contact with the ball.  Maintain physical fitness, including: ? Strength. ? Flexibility. ? Endurance.  Do exercises to strengthen the forearm muscles. Contact a health care provider if:  Your pain does not improve or it gets worse.  You notice numbness in your hand. Get help right away if:  Your pain is severe.  You cannot move your wrist. Summary  Golfer's elbow, also called medial epicondylitis, is a condition that results from inflammation of the strong bands of tissue (tendons) that attach your forearm muscles to the inside of your bone at the elbow.  This injury usually results from overuse.  Symptoms of this condition include decreased grip strength and pain at the inner elbow, forearm, or wrist.  This injury is treated with rest, a brace or splint, ice, medicines, physical therapy, and surgery as needed. This information is not intended to replace advice given to you by your health care provider. Make sure you discuss any questions you have with your health care provider. Document Revised: 04/02/2020 Document Reviewed: 04/02/2020 Elsevier Patient Education  [place].

## 2021-01-30 LAB — CMP14+EGFR
ALT: 12 IU/L (ref 0–32)
AST: 17 IU/L (ref 0–40)
Albumin/Globulin Ratio: 1.9 (ref 1.2–2.2)
Albumin: 4.8 g/dL (ref 3.8–4.9)
Alkaline Phosphatase: 84 IU/L (ref 44–121)
BUN/Creatinine Ratio: 13 (ref 9–23)
BUN: 9 mg/dL (ref 6–24)
Bilirubin Total: 0.6 mg/dL (ref 0.0–1.2)
CO2: 24 mmol/L (ref 20–29)
Calcium: 9.4 mg/dL (ref 8.7–10.2)
Chloride: 103 mmol/L (ref 96–106)
Creatinine, Ser: 0.72 mg/dL (ref 0.57–1.00)
Globulin, Total: 2.5 g/dL (ref 1.5–4.5)
Glucose: 103 mg/dL — ABNORMAL HIGH (ref 65–99)
Potassium: 4.9 mmol/L (ref 3.5–5.2)
Sodium: 144 mmol/L (ref 134–144)
Total Protein: 7.3 g/dL (ref 6.0–8.5)
eGFR: 98 mL/min/{1.73_m2} (ref >59)

## 2021-01-30 LAB — CBC
Hematocrit: 40.8 % (ref 34.0–46.6)
Hemoglobin: 13.3 g/dL (ref 11.1–15.9)
MCH: 29.2 pg (ref 26.6–33.0)
MCHC: 32.6 g/dL (ref 31.5–35.7)
MCV: 90 fL (ref 79–97)
Platelets: 377 10*3/uL (ref 150–450)
RBC: 4.55 x10E6/uL (ref 3.77–5.28)
RDW: 12.2 % (ref 11.7–15.4)
WBC: 6.8 10*3/uL (ref 3.4–10.8)

## 2021-01-30 LAB — LIPID PANEL
Chol/HDL Ratio: 6.2 ratio — ABNORMAL HIGH (ref 0.0–4.4)
Cholesterol, Total: 256 mg/dL — ABNORMAL HIGH (ref 100–199)
HDL: 41 mg/dL (ref >39)
LDL Chol Calc (NIH): 165 mg/dL — ABNORMAL HIGH (ref 0–99)
Triglycerides: 266 mg/dL — ABNORMAL HIGH (ref 0–149)
VLDL Cholesterol Cal: 50 mg/dL — ABNORMAL HIGH (ref 5–40)

## 2021-01-30 LAB — TSH: TSH: 2.07 u[IU]/mL (ref 0.450–4.500)

## 2021-02-03 ENCOUNTER — Other Ambulatory Visit: Payer: Self-pay | Admitting: Family Medicine

## 2021-02-03 DIAGNOSIS — Z1231 Encounter for screening mammogram for malignant neoplasm of breast: Secondary | ICD-10-CM

## 2021-02-19 ENCOUNTER — Other Ambulatory Visit: Payer: Self-pay | Admitting: Family Medicine

## 2021-02-19 DIAGNOSIS — F419 Anxiety disorder, unspecified: Secondary | ICD-10-CM

## 2021-02-21 ENCOUNTER — Ambulatory Visit: Payer: BC Managed Care – PPO | Admitting: Family Medicine

## 2021-02-21 ENCOUNTER — Encounter: Payer: Self-pay | Admitting: Family Medicine

## 2021-02-21 DIAGNOSIS — F418 Other specified anxiety disorders: Secondary | ICD-10-CM | POA: Diagnosis not present

## 2021-02-21 DIAGNOSIS — F419 Anxiety disorder, unspecified: Secondary | ICD-10-CM

## 2021-02-21 MED ORDER — BUSPIRONE HCL 10 MG PO TABS: 10.0000 mg | ORAL_TABLET | Freq: Two times a day (BID) | ORAL | 1 refills | Status: DC

## 2021-02-21 NOTE — Progress Notes (Signed)
Telephone visit  Subjective: CC: f/u GAD PCP: Janora Norlander, DO RUE:[id number] Noon is a 55 y.o. female calls for telephone consult today. Patient provides verbal consent for consult held via phone.  Due to COVID-19 pandemic this visit was conducted virtually. This visit type was conducted due to national recommendations for restrictions regarding the COVID-19 Pandemic (e.g. social distancing, sheltering in place) in an effort to limit this patient's exposure and mitigate transmission in our community. All issues noted in this document were discussed and addressed.  A physical exam was not performed with this format.   Location of patient: home Location of provider: [facility] Others present for call: none   1.  GAD Her mind is calmer.  When she wakes up in the middle the night she is able to get back to sleep easily.  She feels pretty good on the BuSpar 10 mg twice daily along with her Celexa 40 mg daily.  She does not wish to change the dose at this time.  Continues to have some situational anxiety but this particularly pertains to the fact that she is remodeling her home.  She is able to not to have worry when she is at work but this does seem to be exacerbated by the disheveled condition of her home right now.   ROS: Per HPI  Allergies  Allergen Reactions  . Penicillins Hives   Past Medical History:  Diagnosis Date  . Anxiety   . Depression   . Seasonal allergies     Current Outpatient Medications:  .  busPIRone (BUSPAR) 5 MG tablet, Take 1 tablet (5 mg total) by mouth 2 (two) times daily for 7 days, THEN 1.5 tablets (7.5 mg total) 2 (two) times daily for 7 days, THEN 2 tablets (10 mg total) 2 (two) times daily for 14 days., Disp: 91 tablet, Rfl: 0 .  citalopram (CELEXA) 40 MG tablet, Take 1 tablet (40 mg total) by mouth daily., Disp: 90 tablet, Rfl: 3 .  Melatonin 10 MG TABS, Take by mouth., Disp: , Rfl:  .  montelukast (SINGULAIR) 10 MG tablet, TAKE 1 TABLET BY MOUTH EVERYDAY  AT BEDTIME, Disp: 90 tablet, Rfl: 1 .  Multiple Vitamin (MULTIVITAMIN) tablet, Take 1 tablet by mouth daily., Disp: , Rfl:  .  Probiotic Product (TRUBIOTICS PO), Take by mouth daily., Disp: , Rfl:   GAD 7 : Generalized Anxiety Score 02/21/2021 01/09/2020  Nervous, Anxious, on Edge 2 0  Control/stop worrying 0 0  Worry too much - different things 0 0  Trouble relaxing 1 0  Restless 0 0  Easily annoyed or irritable 0 0  Afraid - awful might happen 0 0  Total GAD 7 Score 3 0      Assessment/ Plan: 55 y.o. female   Anxiety - Plan: busPIRone (BUSPAR) 10 MG tablet  Situational anxiety  Anxiety is improving.  We will continue with 10 mg twice daily of BuSpar, Celexa 40 mg daily.  She will contact me should things change and she feels that she needs advance dose.  Otherwise, we will see each other back in 6 months.  Start time: 10:58am End time: 11:05am  Total time spent on patient care (including telephone call/ virtual visit): 7 minutes  [place], [place] [phone]

## 2021-02-26 ENCOUNTER — Ambulatory Visit
Admission: RE | Admit: 2021-02-26 | Discharge: 2021-02-26 | Disposition: A | Discharge status: Home / Self Care | Payer: BC Managed Care – PPO | Source: Ambulatory Visit | Attending: Family Medicine | Admitting: Family Medicine

## 2021-02-26 ENCOUNTER — Other Ambulatory Visit: Payer: Self-pay

## 2021-02-26 DIAGNOSIS — Z1231 Encounter for screening mammogram for malignant neoplasm of breast: Secondary | ICD-10-CM | POA: Diagnosis not present

## 2021-04-22 ENCOUNTER — Ambulatory Visit (AMBULATORY_SURGERY_CENTER): Payer: Self-pay

## 2021-04-22 ENCOUNTER — Other Ambulatory Visit: Payer: Self-pay

## 2021-04-22 VITALS — Ht 67.0 in | Wt 172.0 lb

## 2021-04-22 DIAGNOSIS — Z86010 Personal history of colonic polyps: Secondary | ICD-10-CM

## 2021-04-22 MED ORDER — PEG-KCL-NACL-NASULF-NA ASC-C 100 G PO SOLR: 1.0000 | Freq: Once | ORAL | 0 refills | Status: AC

## 2021-04-22 NOTE — Progress Notes (Signed)
No allergies to soy or egg Pt is not on blood thinners or diet pills Denies issues with sedation/intubation Denies atrial flutter/fib Denies constipation   Emmi instructions given to pt  Pt is aware of Covid safety and care partner requirements.  

## 2021-05-06 ENCOUNTER — Encounter: Payer: BLUE CROSS/BLUE SHIELD | Admitting: Internal Medicine

## 2021-05-09 ENCOUNTER — Ambulatory Visit (AMBULATORY_SURGERY_CENTER): Payer: BC Managed Care – PPO | Admitting: Internal Medicine

## 2021-05-09 ENCOUNTER — Encounter: Payer: Self-pay | Admitting: Internal Medicine

## 2021-05-09 ENCOUNTER — Other Ambulatory Visit: Payer: Self-pay

## 2021-05-09 VITALS — BP 129/50 | HR 54 | Temp 97.7°F | Resp 11 | Ht 67.0 in | Wt 172.0 lb

## 2021-05-09 DIAGNOSIS — Z86010 Personal history of colonic polyps: Secondary | ICD-10-CM | POA: Diagnosis not present

## 2021-05-09 DIAGNOSIS — Z1211 Encounter for screening for malignant neoplasm of colon: Secondary | ICD-10-CM | POA: Diagnosis not present

## 2021-05-09 DIAGNOSIS — D123 Benign neoplasm of transverse colon: Secondary | ICD-10-CM

## 2021-05-09 MED ORDER — SODIUM CHLORIDE 0.9 % IV SOLN: 500.0000 mL | Freq: Once | INTRAVENOUS | Status: DC

## 2021-05-09 NOTE — Progress Notes (Signed)
Pt's states no medical or surgical changes since previsit or office visit. 

## 2021-05-09 NOTE — Progress Notes (Signed)
VS taken by DT 

## 2021-05-09 NOTE — Patient Instructions (Addendum)
YOU HAD AN ENDOSCOPIC PROCEDURE TODAY AT [facility]:   Refer to the procedure report that was given to you for any specific questions about what was found during the examination.  If the procedure report does not answer your questions, please call your gastroenterologist to clarify.  If you requested that your care partner not be given the details of your procedure findings, then the procedure report has been included in a sealed envelope for you to review at your convenience later.  YOU SHOULD EXPECT: Some feelings of bloating in the abdomen. Passage of more gas than usual.  Walking can help get rid of the air that was put into your GI tract during the procedure and reduce the bloating. If you had a lower endoscopy (such as a colonoscopy or flexible sigmoidoscopy) you may notice spotting of blood in your stool or on the toilet paper. If you underwent a bowel prep for your procedure, you may not have a normal bowel movement for a few days.  Please Note:  You might notice some irritation and congestion in your nose or some drainage.  This is from the oxygen used during your procedure.  There is no need for concern and it should clear up in a day or so.  SYMPTOMS TO REPORT IMMEDIATELY:  Following lower endoscopy (colonoscopy or flexible sigmoidoscopy):  Excessive amounts of blood in the stool  Significant tenderness or worsening of abdominal pains  Swelling of the abdomen that is new, acute  Fever of 100F or higher   For urgent or emergent issues, a gastroenterologist can be reached at any hour by calling [phone]. Do not use MyChart messaging for urgent concerns.    DIET:  We do recommend a small meal at first, but then you may proceed to your regular diet.  Drink plenty of fluids but you should avoid alcoholic beverages for 24 hours.  ACTIVITY:  You should plan to take it easy for the rest of today and you should NOT DRIVE or use heavy machinery until tomorrow (because  of the sedation medicines used during the test).    FOLLOW UP: Our staff will call the number listed on your records 48-72 hours following your procedure to check on you and address any questions or concerns that you may have regarding the information given to you following your procedure. If we do not reach you, we will leave a message.  We will attempt to reach you two times.  During this call, we will ask if you have developed any symptoms of COVID 19. If you develop any symptoms (ie: fever, flu-like symptoms, shortness of breath, cough etc.) before then, please call [phone].  If you test positive for Covid 19 in the 2 weeks post procedure, please call and report this information to [place].    If any biopsies were taken you will be contacted by phone or by letter within the next 1-3 weeks.  Please call us at [phone] if you have not heard about the biopsies in 3 weeks.    SIGNATURES/CONFIDENTIALITY: You and/or your care partner have signed paperwork which will be entered into your electronic medical record.  These signatures attest to the fact that that the information above on your After Visit Summary has been reviewed and is understood.  Full responsibility of the confidentiality of this discharge information lies with you and/or your care-partner.    Resume medications. Information given on polyps,diverticulosis and hemorrhoids.

## 2021-05-09 NOTE — Progress Notes (Signed)
pt tolerated well. VSS. awake and to recovery. Report given to RN.  

## 2021-05-09 NOTE — Op Note (Signed)
[facility] Patient Name: Michelle Stafford Procedure Date: 05/09/2021 11:04 AM MRN: [id number] Endoscopist: Docia Chuck. Henrene Pastor , MD Age: 55 Referring MD:  Date of Birth: 04/03/1966 Gender: Female Account #: [id number] Procedure:                Colonoscopy with cold snare polypectomy x 1 Indications:              High risk colon cancer surveillance: Personal                            history of non-advanced adenomas. Previous                            examination 2016 Medicines:                Monitored Anesthesia Care Procedure:                Pre-Anesthesia Assessment:                           - Prior to the procedure, a History and Physical                            was performed, and patient medications and                            allergies were reviewed. The patient's tolerance of                            previous anesthesia was also reviewed. The risks                            and benefits of the procedure and the sedation                            options and risks were discussed with the patient.                            All questions were answered, and informed consent                            was obtained. Prior Anticoagulants: The patient has                            taken no previous anticoagulant or antiplatelet                            agents. ASA Grade Assessment: II - A patient with                            mild systemic disease. After reviewing the risks                            and benefits, the patient was deemed in  satisfactory condition to undergo the procedure.                           After obtaining informed consent, the colonoscope                            was passed under direct vision. Throughout the                            procedure, the patient's blood pressure, pulse, and                            oxygen saturations were monitored continuously. The                            CF [id number]  was introduced through the anus                            and advanced to the the cecum, identified by                            appendiceal orifice and ileocecal valve. The                            ileocecal valve, appendiceal orifice, and rectum                            were photographed. The quality of the bowel                            preparation was excellent. The colonoscopy was                            performed without difficulty. The patient tolerated                            the procedure well. The bowel preparation used was                            MoviPrep via split dose instruction. Scope In: 11:16:44 AM Scope Out: 11:31:46 AM Scope Withdrawal Time: 0 hours 11 minutes 3 seconds  Total Procedure Duration: 0 hours 15 minutes 2 seconds  Findings:                 A 4 mm polyp was found in the transverse colon. The                            polyp was removed with a cold snare. Resection and                            retrieval were complete.                           Multiple diverticula were found in the cecum, left  colon and right colon.                           Internal hemorrhoids were found during                            retroflexion. The hemorrhoids were moderate.                           The exam was otherwise without abnormality on                            direct and retroflexion views. Complications:            No immediate complications. Estimated blood loss:                            None. Estimated Blood Loss:     Estimated blood loss: none. Impression:               - One 4 mm polyp in the transverse colon, removed                            with a cold snare. Resected and retrieved.                           - Diverticulosis in the cecum, in the left colon                            and in the right colon.                           - Internal hemorrhoids.                           - The examination was otherwise  normal on direct                            and retroflexion views. Recommendation:           - Repeat colonoscopy in 5 years for surveillance.                           - Patient has a contact number available for                            emergencies. The signs and symptoms of potential                            delayed complications were discussed with the                            patient. Return to normal activities tomorrow.                            Written discharge instructions were provided to the  patient.                           - Resume previous diet.                           - Continue present medications.                           - Await pathology results. Docia Chuck. Henrene Pastor, MD 05/09/2021 11:38:18 AM This report has been signed electronically.

## 2021-05-09 NOTE — Progress Notes (Signed)
Called to room to assist during endoscopic procedure.  Patient ID and intended procedure confirmed with present staff. Received instructions for my participation in the procedure from the performing physician.  

## 2021-05-13 ENCOUNTER — Telehealth: Payer: Self-pay

## 2021-05-13 NOTE — Telephone Encounter (Signed)
  Follow up Call-  Call back number 05/09/2021  Post procedure Call Back phone  # [phone]  Permission to leave phone message Yes  Some recent data might be hidden     Patient questions:  Do you have a fever, pain , or abdominal swelling? No. Pain Score  0 *  Have you tolerated food without any problems? Yes.    Have you been able to return to your normal activities? Yes.    Do you have any questions about your discharge instructions: Diet   No. Medications  No. Follow up visit  No.  Do you have questions or concerns about your Care? No.  Actions: * If pain score is 4 or above: No action needed, pain <4.  Have you developed a fever since your procedure? no  2.   Have you had an respiratory symptoms (SOB or cough) since your procedure? no  3.   Have you tested positive for COVID 19 since your procedure no  4.   Have you had any family members/close contacts diagnosed with the COVID 19 since your procedure?  no   If yes to any of these questions please route to Joylene John, RN and Joella Prince, RN

## 2021-05-16 ENCOUNTER — Encounter: Payer: Self-pay | Admitting: Internal Medicine

## 2021-05-29 ENCOUNTER — Encounter: Payer: Self-pay | Admitting: Family Medicine

## 2021-05-29 ENCOUNTER — Ambulatory Visit (INDEPENDENT_AMBULATORY_CARE_PROVIDER_SITE_OTHER): Payer: BC Managed Care – PPO | Admitting: Family Medicine

## 2021-05-29 DIAGNOSIS — R059 Cough, unspecified: Secondary | ICD-10-CM

## 2021-05-29 DIAGNOSIS — M791 Myalgia, unspecified site: Secondary | ICD-10-CM | POA: Diagnosis not present

## 2021-05-29 LAB — VERITOR FLU A/B WAIVED
Influenza A: NEGATIVE
Influenza B: NEGATIVE

## 2021-05-29 NOTE — Progress Notes (Signed)
Virtual Visit via telephone Note Due to COVID-19 pandemic this visit was conducted virtually. This visit type was conducted due to national recommendations for restrictions regarding the COVID-19 Pandemic (e.g. social distancing, sheltering in place) in an effort to limit this patient's exposure and mitigate transmission in our community. All issues noted in this document were discussed and addressed.  A physical exam was not performed with this format.   I connected with Michelle Stafford on 05/29/2021 at [phone] by telephone and verified that I am speaking with the correct person using two identifiers. Michelle Stafford is currently located at [facility] parking lot and  no one  is currently with them during visit. The provider, Monia Pouch, FNP is located in their office at time of visit.  I discussed the limitations, risks, security and privacy concerns of performing an evaluation and management service by telephone and the availability of in person appointments. I also discussed with the patient that there may be a patient responsible charge related to this service. The patient expressed understanding and agreed to proceed.  Subjective:  Patient ID: Michelle Stafford, female    DOB: 1966/04/22, 55 y.o.   MRN: [id number]  Chief Complaint:  Cough and Generalized Body Aches   HPI: Michelle Stafford is a 55 y.o. female presenting on 05/29/2021 for Cough and Generalized Body Aches   Pt reports body aches, chills, headache, cough, congestion and malaise since Sunday. States symptoms are getting worse. She has been taking tylenol with some relief of symptoms.   Cough This is a new problem. The current episode started in the past 7 days. The problem has been gradually worsening. The problem occurs every few minutes. The cough is Non-productive. Associated symptoms include chills, headaches, myalgias, nasal congestion, postnasal drip, rhinorrhea and a sore throat. Pertinent negatives include no chest pain, ear  congestion, ear pain, fever, heartburn, hemoptysis, rash, shortness of breath, sweats, weight loss or wheezing. Nothing aggravates the symptoms. Treatments tried: Tylenol. The treatment provided mild relief.    Relevant past medical, surgical, family, and social history reviewed and updated as indicated.  Allergies and medications reviewed and updated.   Past Medical History:  Diagnosis Date   Anxiety    Depression    Hyperlipidemia    Seasonal allergies     Past Surgical History:  Procedure Laterality Date   COLONOSCOPY     TUBAL LIGATION  1987    Social History   Socioeconomic History   Marital status: Married    Spouse name: Not on file   Number of children: Not on file   Years of education: Not on file   Highest education level: Not on file  Occupational History   Not on file  Tobacco Use   Smoking status: Former    Types: Cigarettes    Quit date: 09/04/2012    Years since quitting: 8.7   Smokeless tobacco: Never  Vaping Use   Vaping Use: Never used  Substance and Sexual Activity   Alcohol use: Yes    Alcohol/week: 16.0 standard drinks    Types: 16 Cans of beer per week   Drug use: Yes    Types: Marijuana    Comment: last use 05/03/21 3 "totes"   Sexual activity: Yes    Birth control/protection: None  Other Topics Concern   Not on file  Social History Narrative   Not on file   Social Determinants of Health   Financial Resource Strain: Not on file  Food Insecurity: Not on file  Transportation Needs: Not on file  Physical Activity: Not on file  Stress: Not on file  Social Connections: Not on file  Intimate Partner Violence: Not on file    Outpatient Encounter Medications as of 05/29/2021  Medication Sig   busPIRone (BUSPAR) 10 MG tablet Take 1 tablet (10 mg total) by mouth 2 (two) times daily.   citalopram (CELEXA) 40 MG tablet Take 1 tablet (40 mg total) by mouth daily.   Melatonin 10 MG TABS Take by mouth.   montelukast (SINGULAIR) 10 MG tablet  TAKE 1 TABLET BY MOUTH EVERYDAY AT BEDTIME   Multiple Vitamin (MULTIVITAMIN) tablet Take 1 tablet by mouth daily.   Probiotic Product (TRUBIOTICS PO) Take by mouth daily.   RESTASIS 0.05 % ophthalmic emulsion 1 drop 2 (two) times daily.   No facility-administered encounter medications on file as of 05/29/2021.    Allergies  Allergen Reactions   Penicillins Hives    Review of Systems  Constitutional:  Positive for activity change, appetite change and chills. Negative for diaphoresis, fatigue, fever, unexpected weight change and weight loss.  HENT:  Positive for congestion, postnasal drip, rhinorrhea and sore throat. Negative for dental problem, drooling, ear discharge, ear pain, facial swelling, hearing loss, mouth sores, nosebleeds, sinus pressure, sinus pain, sneezing, tinnitus, trouble swallowing and voice change.   Eyes: Negative.   Respiratory:  Positive for cough. Negative for hemoptysis, chest tightness, shortness of breath and wheezing.   Cardiovascular:  Negative for chest pain, palpitations and leg swelling.  Gastrointestinal:  Negative for abdominal pain, blood in stool, constipation, diarrhea, heartburn, nausea and vomiting.  Endocrine: Negative.   Genitourinary:  Negative for decreased urine volume, difficulty urinating, dysuria, frequency and urgency.  Musculoskeletal:  Positive for myalgias. Negative for arthralgias, back pain, gait problem, joint swelling, neck pain and neck stiffness.  Skin: Negative.  Negative for rash.  Allergic/Immunologic: Negative.   Neurological:  Positive for headaches. Negative for dizziness, tremors, seizures, syncope, facial asymmetry, speech difficulty, weakness, light-headedness and numbness.  Hematological: Negative.   Psychiatric/Behavioral:  Negative for confusion, hallucinations, sleep disturbance and suicidal ideas.   All other systems reviewed and are negative.       Observations/Objective: No vital signs or physical exam, this was a  telephone or virtual health encounter.  Pt alert and oriented, answers all questions appropriately, and able to speak in full sentences.    Assessment and Plan: Keaghan was seen today for cough and generalized body aches.  Diagnoses and all orders for this visit:  Cough Myalgia Symptoms concerning for COVID-19 or Influenza. Will test for both today. Symptomatic care discussed in detail: Mucinex with plenty of water, Flonase, Tylenol, adequate fluid intake, and rest. Further treatment pending lab results. Pt aware to report any new, worsening, or persistent symptoms. -     Veritor Flu A/B Waived -     Novel Coronavirus, NAA ([facility])    Follow Up Instructions: Return if symptoms worsen or fail to improve.    I discussed the assessment and treatment plan with the patient. The patient was provided an opportunity to ask questions and all were answered. The patient agreed with the plan and demonstrated an understanding of the instructions.   The patient was advised to call back or seek an in-person evaluation if the symptoms worsen or if the condition fails to improve as anticipated.  The above assessment and management plan was discussed with the patient. The patient verbalized understanding of and has agreed to the management plan. Patient is  aware to call the clinic if they develop any new symptoms or if symptoms persist or worsen. Patient is aware when to return to the clinic for a follow-up visit. Patient educated on when it is appropriate to go to the emergency department.    I provided 11 minutes of non-face-to-face time during this encounter. The call started at 0822. The call ended at 0830. The other time was used for coordination of care.    Monia Pouch, FNP-C [facility] [address] [place], [place] [address] [phone] 05/29/2021

## 2021-05-30 ENCOUNTER — Ambulatory Visit: Payer: BC Managed Care – PPO | Admitting: Nurse Practitioner

## 2021-05-30 LAB — SARS-COV-2, NAA 2 DAY TAT

## 2021-05-30 LAB — NOVEL CORONAVIRUS, NAA: SARS-CoV-2, NAA: NOT DETECTED

## 2021-06-28 ENCOUNTER — Other Ambulatory Visit: Payer: Self-pay | Admitting: Family Medicine

## 2021-06-28 DIAGNOSIS — F419 Anxiety disorder, unspecified: Secondary | ICD-10-CM

## 2021-07-07 ENCOUNTER — Encounter: Payer: Self-pay | Admitting: Nurse Practitioner

## 2021-07-07 ENCOUNTER — Ambulatory Visit: Payer: BC Managed Care – PPO | Admitting: Nurse Practitioner

## 2021-07-07 DIAGNOSIS — J0110 Acute frontal sinusitis, unspecified: Secondary | ICD-10-CM | POA: Diagnosis not present

## 2021-07-07 MED ORDER — ACETAMINOPHEN 500 MG PO TABS: 500.0000 mg | ORAL_TABLET | Freq: Four times a day (QID) | ORAL | 0 refills | Status: AC | PRN

## 2021-07-07 MED ORDER — DM-GUAIFENESIN ER 30-600 MG PO TB12: 1.0000 | ORAL_TABLET | Freq: Two times a day (BID) | ORAL | 0 refills | Status: AC

## 2021-07-07 MED ORDER — AZITHROMYCIN 250 MG PO TABS: ORAL_TABLET | ORAL | 0 refills | Status: AC

## 2021-07-07 NOTE — Assessment & Plan Note (Signed)
Take meds as prescribed - Use a cool mist humidifier  -Use saline nose sprays frequently -Force fluids -Azithromycin 250 mg , 2 tablet on day 1, 1 tablet day 2-5 -For fever or aches or pains- take Tylenol or ibuprofen. -If symptoms do not improve, she may need to be COVID tested to rule this out, an at home test already negative Follow up with worsening unresolved symptoms

## 2021-07-07 NOTE — Progress Notes (Signed)
   Virtual Visit  Note Due to COVID-19 pandemic this visit was conducted virtually. This visit type was conducted due to national recommendations for restrictions regarding the COVID-19 Pandemic (e.g. social distancing, sheltering in place) in an effort to limit this patient's exposure and mitigate transmission in our community. All issues noted in this document were discussed and addressed.  A physical exam was not performed with this format. :0 I connected with Michelle Stafford on 07/07/21 at 8:15 am  by telephone and verified that I am speaking with the correct person using two identifiers. Michelle Stafford is currently located at home during visit. The provider, Ivy Lynn, NP is located in their office at time of visit.  I discussed the limitations, risks, security and privacy concerns of performing an evaluation and management service by telephone and the availability of in person appointments. I also discussed with the patient that there may be a patient responsible charge related to this service. The patient expressed understanding and agreed to proceed.   History and Present Illness:  Sinusitis This is a new problem. Episode onset: in the past 3-4 days. The problem has been gradually worsening since onset. There has been no fever. The pain is moderate. Associated symptoms include congestion, coughing, ear pain, neck pain and a sore throat. Pertinent negatives include no chills or shortness of breath. Past treatments include nothing.     Review of Systems  Constitutional:  Negative for chills.  HENT:  Positive for congestion, ear pain and sore throat.   Respiratory:  Positive for cough. Negative for shortness of breath.   Cardiovascular: Negative.   Musculoskeletal:  Positive for neck pain.  Skin:  Negative for rash.  All other systems reviewed and are negative.   Observations/Objective: Tele-visit patient not in distress  Assessment and Plan: Take meds as prescribed - Use a  cool mist humidifier  -Use saline nose sprays frequently -Force fluids -Azithromycin 250 mg , 2 tablet on day 1, 1 tablet day 2-5 -For fever or aches or pains- take Tylenol or ibuprofen. -If symptoms do not improve, she may need to be COVID tested to rule this out, an at home test already negative Follow up with worsening unresolved symptoms   Follow Up Instructions: Follow up with worsening unresolved symptoms    I discussed the assessment and treatment plan with the patient. The patient was provided an opportunity to ask questions and all were answered. The patient agreed with the plan and demonstrated an understanding of the instructions.   The patient was advised to call back or seek an in-person evaluation if the symptoms worsen or if the condition fails to improve as anticipated.  The above assessment and management plan was discussed with the patient. The patient verbalized understanding of and has agreed to the management plan. Patient is aware to call the clinic if symptoms persist or worsen. Patient is aware when to return to the clinic for a follow-up visit. Patient educated on when it is appropriate to go to the emergency department.   Time call ended: 08:08 am   I provided 8 minutes of  non face-to-face time during this encounter.    Ivy Lynn, NP

## 2021-09-01 ENCOUNTER — Other Ambulatory Visit: Payer: Self-pay | Admitting: Family Medicine

## 2021-09-01 DIAGNOSIS — F419 Anxiety disorder, unspecified: Secondary | ICD-10-CM

## 2021-09-05 ENCOUNTER — Other Ambulatory Visit: Payer: Self-pay | Admitting: Family Medicine

## 2021-09-05 DIAGNOSIS — F419 Anxiety disorder, unspecified: Secondary | ICD-10-CM

## 2021-09-05 NOTE — Telephone Encounter (Signed)
[place]. NTBS 30 days given 08/04/21

## 2021-09-09 ENCOUNTER — Encounter: Payer: Self-pay | Admitting: Family Medicine

## 2021-09-09 NOTE — Telephone Encounter (Signed)
Called Pt to schedule appt for med refill N/A NO VM. Letter [employer]

## 2021-10-06 ENCOUNTER — Other Ambulatory Visit: Payer: Self-pay | Admitting: Family Medicine

## 2021-10-06 DIAGNOSIS — F419 Anxiety disorder, unspecified: Secondary | ICD-10-CM

## 2021-10-30 ENCOUNTER — Other Ambulatory Visit: Payer: Self-pay | Admitting: Family Medicine

## 2021-10-30 DIAGNOSIS — F419 Anxiety disorder, unspecified: Secondary | ICD-10-CM

## 2021-11-05 ENCOUNTER — Ambulatory Visit: Payer: No Typology Code available for payment source | Admitting: Family Medicine

## 2021-11-05 ENCOUNTER — Encounter: Payer: Self-pay | Admitting: Family Medicine

## 2021-11-05 VITALS — BP 137/87 | HR 76 | Temp 97.3°F | Ht 67.0 in | Wt 173.2 lb

## 2021-11-05 DIAGNOSIS — Z780 Asymptomatic menopausal state: Secondary | ICD-10-CM

## 2021-11-05 DIAGNOSIS — J302 Other seasonal allergic rhinitis: Secondary | ICD-10-CM | POA: Diagnosis not present

## 2021-11-05 DIAGNOSIS — F419 Anxiety disorder, unspecified: Secondary | ICD-10-CM | POA: Diagnosis not present

## 2021-11-05 MED ORDER — BUSPIRONE HCL 15 MG PO TABS: 15.0000 mg | ORAL_TABLET | Freq: Two times a day (BID) | ORAL | 3 refills | Status: DC

## 2021-11-05 MED ORDER — MONTELUKAST SODIUM 10 MG PO TABS: 10.0000 mg | ORAL_TABLET | Freq: Every day | ORAL | 3 refills | Status: DC

## 2021-11-05 MED ORDER — CITALOPRAM HYDROBROMIDE 40 MG PO TABS: 40.0000 mg | ORAL_TABLET | Freq: Every day | ORAL | 3 refills | Status: DC

## 2021-11-05 NOTE — Progress Notes (Signed)
Subjective: CC: Follow-up anxiety disorder PCP: Janora Norlander, DO ION:[id number] Michelle Stafford is a 56 y.o. female presenting to clinic today for:  1.  Anxiety disorder Patient reports that her anxiety has been under fair control with BuSpar 10 mg twice daily.  She would like to go ahead and advance this dose as she still feels like she cannot quite settle down in the evening times.  She is compliant with Celexa 40.  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  She really enjoys her new job.   ROS: Per HPI  Allergies  Allergen Reactions   Penicillins Hives   Past Medical History:  Diagnosis Date   Anxiety    Depression    Hyperlipidemia    Seasonal allergies     Current Outpatient Medications:    acetaminophen (TYLENOL) 500 MG tablet, Take 1 tablet (500 mg total) by mouth every 6 (six) hours as needed., Disp: 30 tablet, Rfl: 0   busPIRone (BUSPAR) 10 MG tablet, TAKE 1 TABLET (10 MG TOTAL) BY MOUTH 2 (TWO) TIMES DAILY. (NEEDS TO BE SEEN BEFORE NEXT REFILL), Disp: 180 tablet, Rfl: 0   citalopram (CELEXA) 40 MG tablet, Take 1 tablet (40 mg total) by mouth daily., Disp: 90 tablet, Rfl: 3   dextromethorphan-guaiFENesin (MUCINEX DM) 30-600 MG 12hr tablet, Take 1 tablet by mouth 2 (two) times daily., Disp: 30 tablet, Rfl: 0   Melatonin 10 MG TABS, Take by mouth., Disp: , Rfl:    montelukast (SINGULAIR) 10 MG tablet, TAKE 1 TABLET BY MOUTH EVERYDAY AT BEDTIME, Disp: 90 tablet, Rfl: 0   Multiple Vitamin (MULTIVITAMIN) tablet, Take 1 tablet by mouth daily., Disp: , Rfl:    Probiotic Product (TRUBIOTICS PO), Take by mouth daily., Disp: , Rfl:    RESTASIS 0.05 % ophthalmic emulsion, 1 drop 2 (two) times daily., Disp: , Rfl:  Social History   Socioeconomic History   Marital status: Married    Spouse name: Not on file   Number of children: Not on file   Years of education: Not on file   Highest education level: Not on file  Occupational History   Not on  file  Tobacco Use   Smoking status: Former    Types: Cigarettes    Quit date: 09/04/2012    Years since quitting: 9.1   Smokeless tobacco: Never  Vaping Use   Vaping Use: Never used  Substance and Sexual Activity   Alcohol use: Yes    Alcohol/week: 16.0 standard drinks    Types: 16 Cans of beer per week   Drug use: Yes    Types: Marijuana    Comment: last use 05/03/21 3 "totes"   Sexual activity: Yes    Birth control/protection: None  Other Topics Concern   Not on file  Social History Narrative   Not on file   Social Determinants of Health   Financial Resource Strain: Not on file  Food Insecurity: Not on file  Transportation Needs: Not on file  Physical Activity: Not on file  Stress: Not on file  Social Connections: Not on file  Intimate Partner Violence: Not on file   Family History  Problem Relation Age of Onset   Colon polyps Father    Cancer Father        stomach   Stomach cancer Father    COPD Brother    Esophageal cancer Maternal Uncle    Colon polyps Paternal Aunt    Colon polyps Paternal Uncle  Rectal cancer Cousin    Colon cancer Neg Hx     Objective: Office vital signs reviewed. BP 137/87    Pulse 76    Temp (!) 97.3 F (36.3 C)    Ht 5\' 7"  (1.702 m)    Wt 173 lb 3.2 oz (78.6 kg)    LMP 03/10/2013    SpO2 97%    BMI 27.13 kg/m   Physical Examination:  General: Awake, alert, well nourished, No acute distress HEENT: Sclera white Cardio: regular rate and rhythm, S1S2 heard, no murmurs appreciated Pulm: clear to auscultation bilaterally, no wheezes, rhonchi or rales; normal work of breathing on room air Psych: Mood stable, speech normal, affect appropriate.  Pleasant and interactive  Depression screen [facility] 2/9 11/05/2021 01/29/2021 01/09/2020  Decreased Interest 0 0 0  Down, Depressed, Hopeless 0 0 0  PHQ - 2 Score 0 0 0  Altered sleeping - - 0  Tired, decreased energy - - 0  Change in appetite - - 0  Feeling bad or failure about yourself  - - 0   Trouble concentrating - - 0  Moving slowly or fidgety/restless - - 0  Suicidal thoughts - - 0  PHQ-9 Score - - 0   GAD 7 : Generalized Anxiety Score 11/05/2021 02/21/2021 01/09/2020  Nervous, Anxious, on Edge 0 2 0  Control/stop worrying 0 0 0  Worry too much - different things 0 0 0  Trouble relaxing 0 1 0  Restless 0 0 0  Easily annoyed or irritable 0 0 0  Afraid - awful might happen 0 0 0  Total GAD 7 Score 0 3 0  Anxiety Difficulty Not difficult at all - -    Assessment/ Plan: 56 y.o. female   Anxiety - Plan: busPIRone (BUSPAR) 15 MG tablet, citalopram (CELEXA) 40 MG tablet  Menopause - Plan: citalopram (CELEXA) 40 MG tablet  Seasonal allergic rhinitis, unspecified trigger - Plan: montelukast (SINGULAIR) 10 MG tablet  Advance BuSpar to 15 mg twice daily.  Continue Celexa 40 mg daily  Rhinitis not discussed today but Singulair renewed along with her other medications.  She may follow-up at her earliest convenience for her full physical exam with fasting labs  No orders of the defined types were placed in this encounter.  No orders of the defined types were placed in this encounter.    Janora Norlander, DO [place] [phone]

## 2022-01-09 IMAGING — MG MM DIGITAL SCREENING BILAT W/ TOMO AND CAD
8 series · 9 of 24 positions shown · non-contrast
Comparison: Previous exams from [REDACTED] [REDACTED].

CLINICAL DATA: Screening.

EXAM:
DIGITAL SCREENING BILATERAL MAMMOGRAM WITH TOMOSYNTHESIS AND CAD
TECHNIQUE: Bilateral screening digital craniocaudal and mediolateral oblique
mammograms were obtained. Bilateral screening digital breast
tomosynthesis was performed. The images were evaluated with
computer-aided detection.

[R MLO synth-2D]
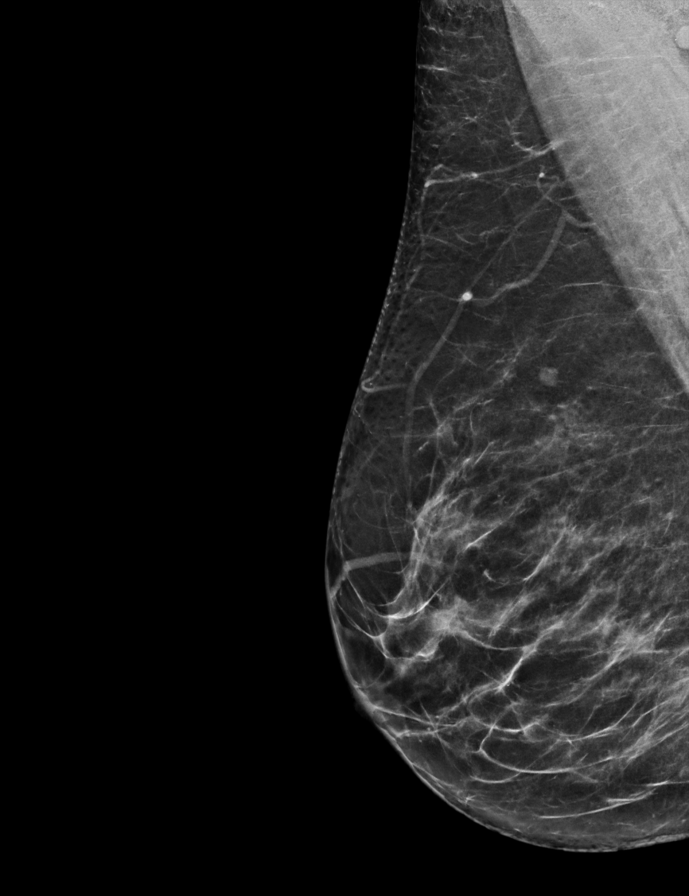

[R CC synth-2D]
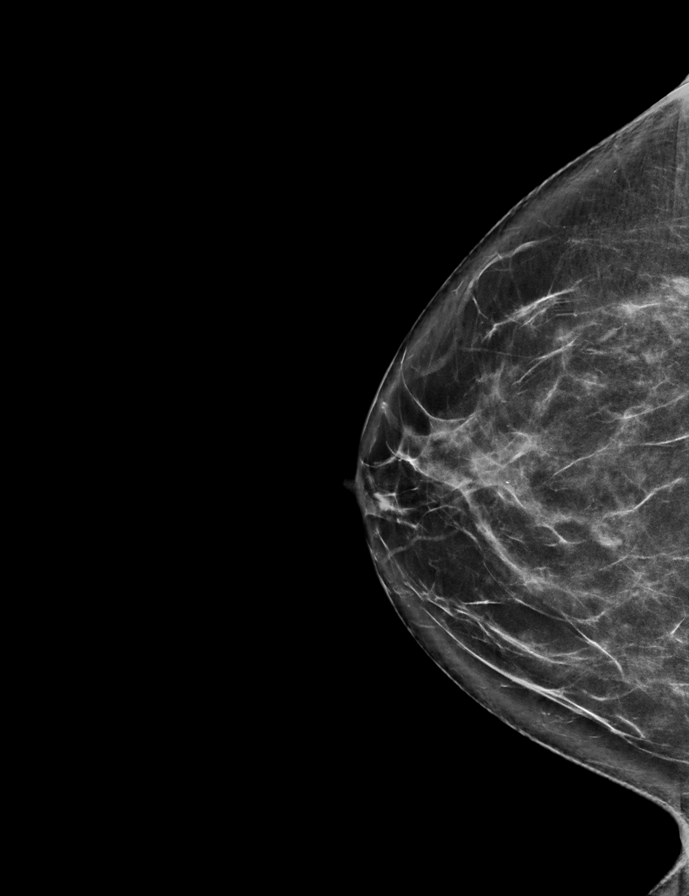

[L MLO synth-2D]
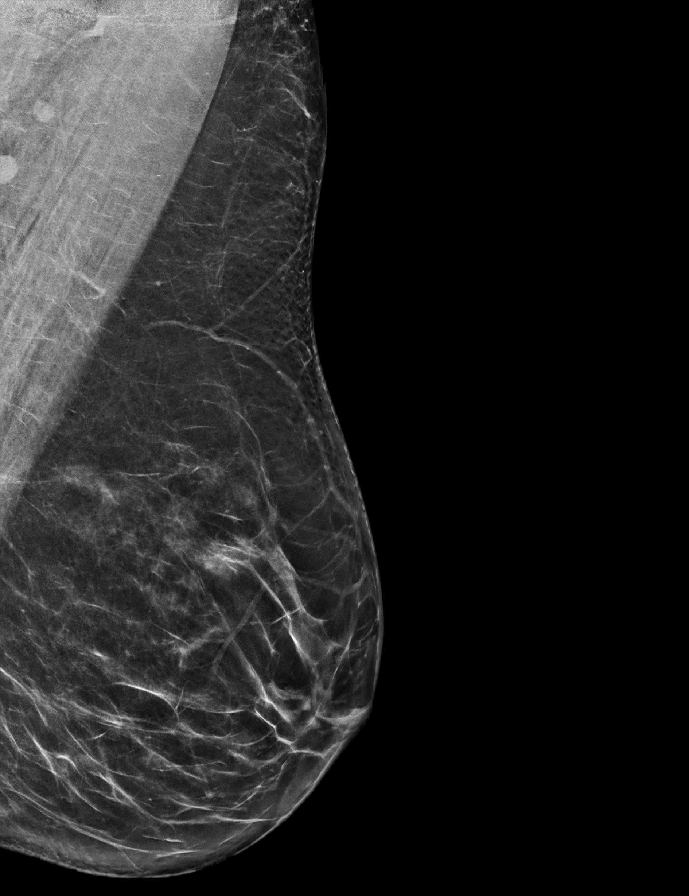

[L CC synth-2D]
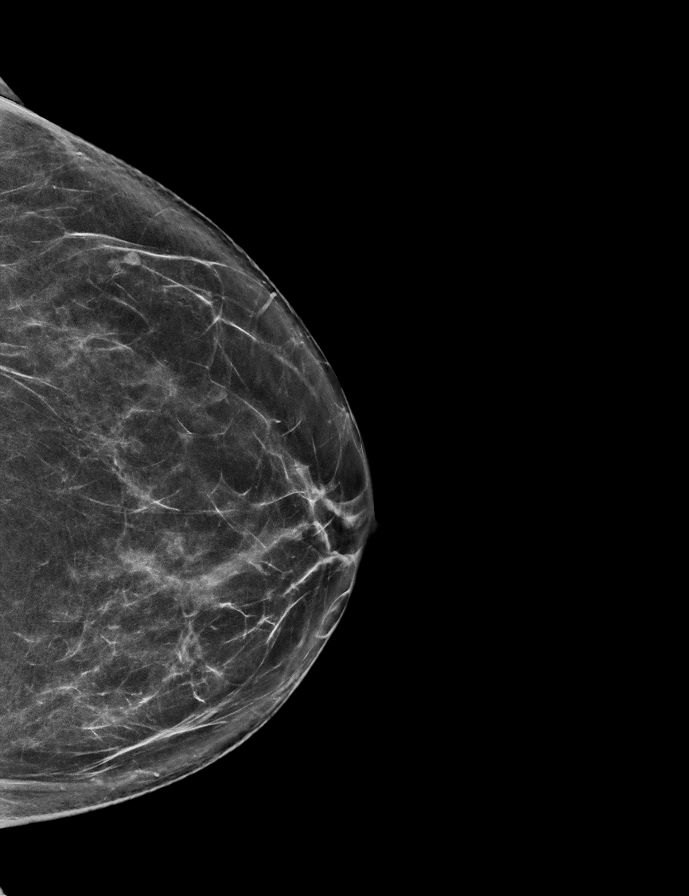

[L MLO tomo · 2 of 69 frames shown]
[frame 23/69]
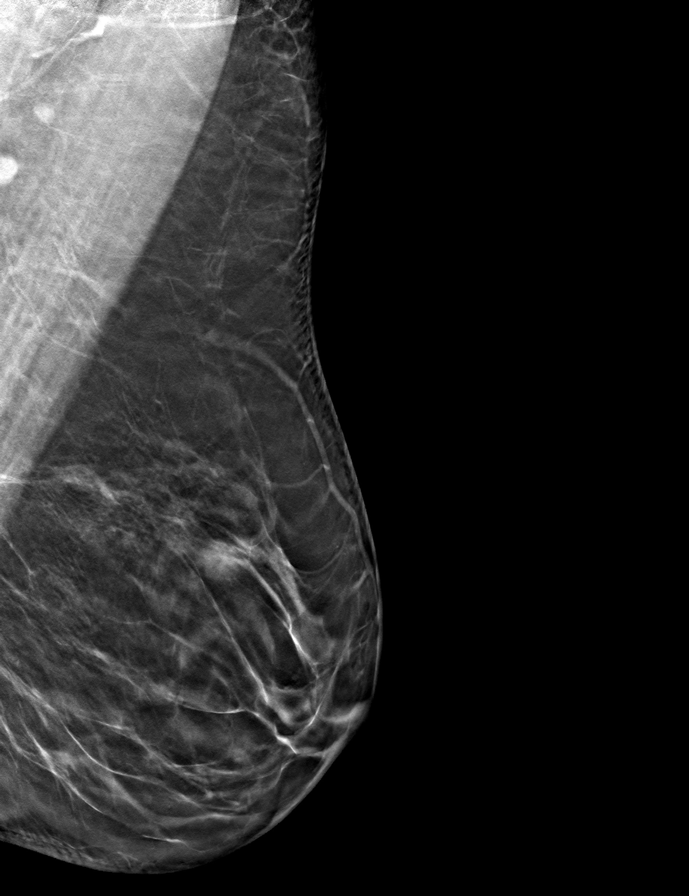
[frame 35/69]
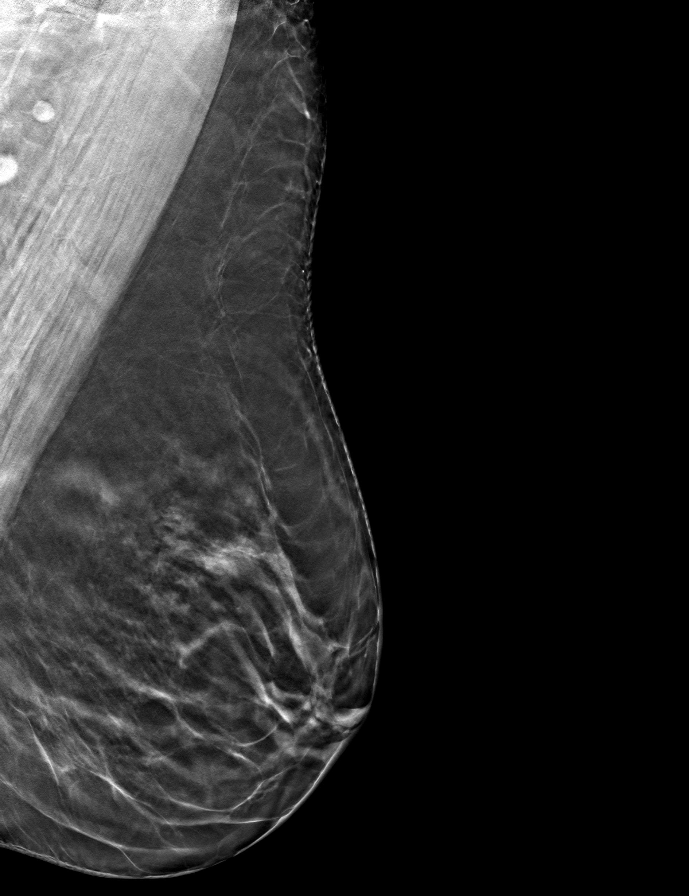

[R CC tomo · tomo slice 35/69.0]
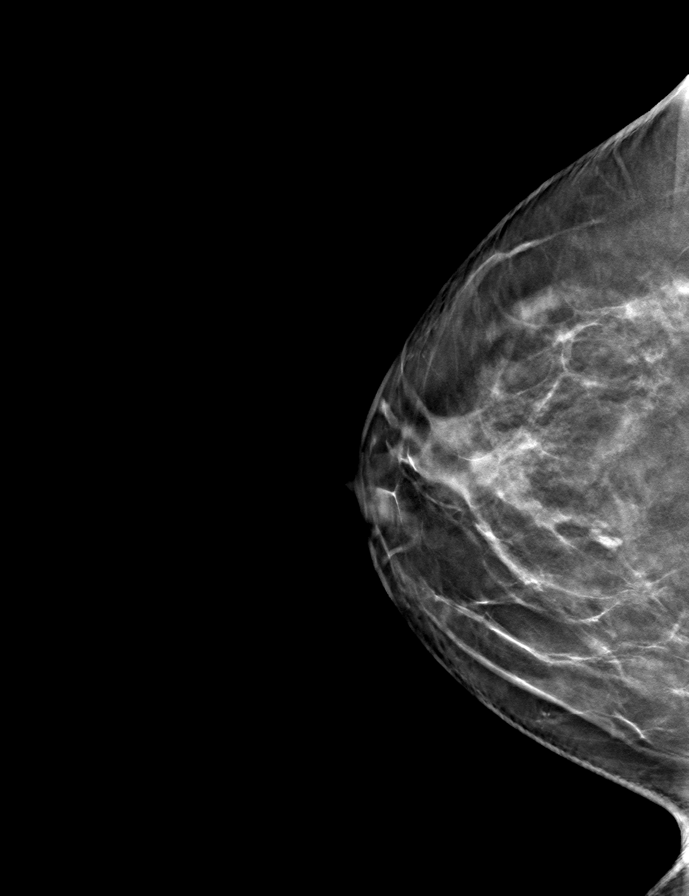

[L CC tomo · tomo slice 35/68.0]
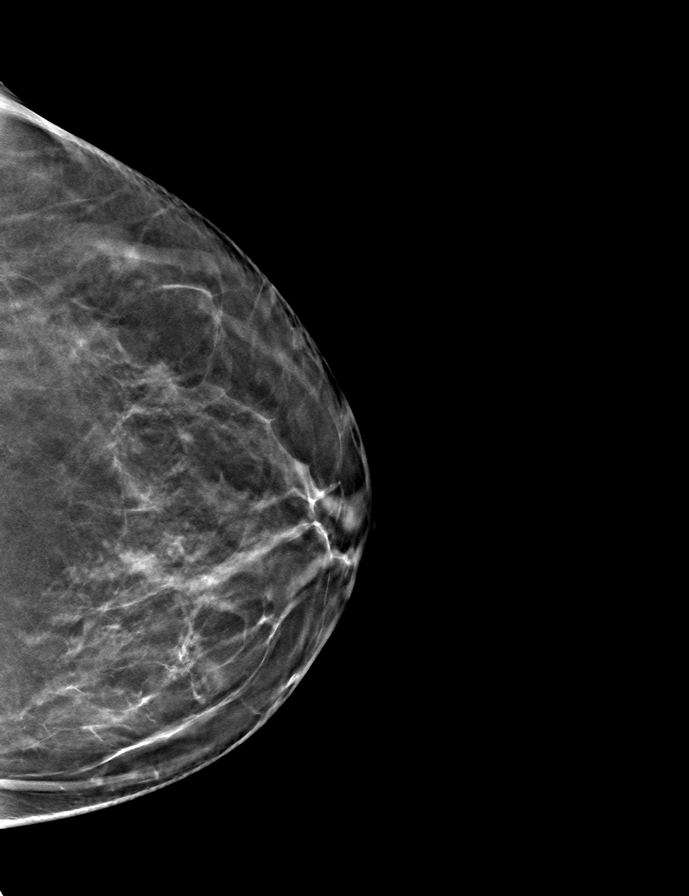

[R MLO tomo · tomo slice 31/62.0]
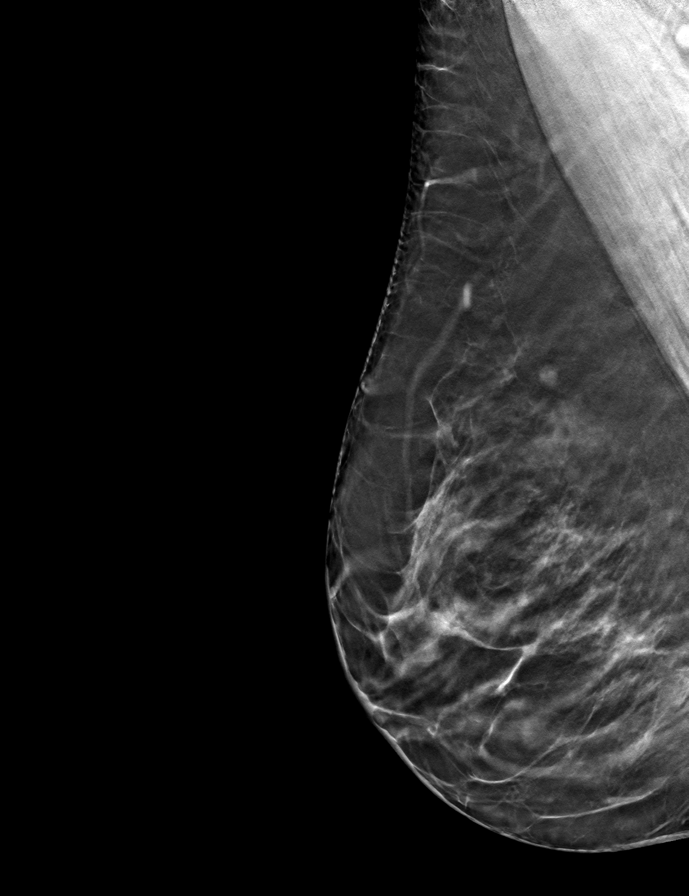

[9 of 24 positions shown; findings below may reference images not displayed]

Awaiting the prior outside mammograms accounts for the delay in this
report.

ACR Breast Density Category b: There are scattered areas of
fibroglandular density.
FINDINGS: There are no findings suspicious for malignancy. The images were
evaluated with computer-aided detection.
IMPRESSION: No mammographic evidence of malignancy. A result letter of this
screening mammogram will be mailed directly to the patient.

RECOMMENDATION:
Screening mammogram in one year. (Code:Z6-N-R4C)

BI-RADS CATEGORY  1: Negative.

## 2022-10-17 ENCOUNTER — Other Ambulatory Visit: Payer: Self-pay | Admitting: Family Medicine

## 2022-10-17 DIAGNOSIS — F419 Anxiety disorder, unspecified: Secondary | ICD-10-CM

## 2022-10-17 DIAGNOSIS — Z780 Asymptomatic menopausal state: Secondary | ICD-10-CM

## 2022-10-19 ENCOUNTER — Telehealth: Payer: Self-pay | Admitting: Family Medicine

## 2022-10-19 ENCOUNTER — Other Ambulatory Visit: Payer: Self-pay | Admitting: Family Medicine

## 2022-10-19 DIAGNOSIS — F419 Anxiety disorder, unspecified: Secondary | ICD-10-CM

## 2022-10-19 NOTE — Telephone Encounter (Signed)
  Prescription Request  10/19/2022  Is this a "Controlled Substance" medicine? NO  Have you seen your PCP in the last 2 weeks? No pt has appt on 02/19/23 for CPE with Dr. Darnell Level.   If YES, route message to pool  -  If NO, patient needs to be scheduled for appointment.  What is the name of the medication or equipment? busPIRone (BUSPAR) 15 MG tablet  citalopram (CELEXA) 40 MG tablet   Have you contacted your pharmacy to request a refill? yes   Which pharmacy would you like this sent to? [facility]    Patient notified that their request is being sent to the clinical staff for review and that they should receive a response within 2 business days.

## 2022-10-19 NOTE — Telephone Encounter (Signed)
Pt was instructed to follow up in 73mafter her Feb 2023 visit. She has not been seen in the office since. Pt does have a CPE scheduled in May. Can enough refills be sent I until then?

## 2022-10-20 ENCOUNTER — Other Ambulatory Visit: Payer: Self-pay | Admitting: Family Medicine

## 2022-10-20 DIAGNOSIS — Z780 Asymptomatic menopausal state: Secondary | ICD-10-CM

## 2022-10-20 DIAGNOSIS — F419 Anxiety disorder, unspecified: Secondary | ICD-10-CM

## 2022-10-20 MED ORDER — CITALOPRAM HYDROBROMIDE 40 MG PO TABS: 40.0000 mg | ORAL_TABLET | Freq: Every day | ORAL | 0 refills | Status: DC

## 2022-10-20 MED ORDER — BUSPIRONE HCL 15 MG PO TABS: 15.0000 mg | ORAL_TABLET | Freq: Two times a day (BID) | ORAL | 0 refills | Status: DC

## 2022-10-20 NOTE — Telephone Encounter (Signed)
Patient aware and thankful

## 2022-10-20 NOTE — Telephone Encounter (Signed)
done

## 2022-11-02 ENCOUNTER — Other Ambulatory Visit: Payer: Self-pay | Admitting: Family Medicine

## 2022-11-02 DIAGNOSIS — F419 Anxiety disorder, unspecified: Secondary | ICD-10-CM

## 2022-11-27 ENCOUNTER — Other Ambulatory Visit: Payer: Self-pay | Admitting: Family Medicine

## 2022-11-27 DIAGNOSIS — J302 Other seasonal allergic rhinitis: Secondary | ICD-10-CM

## 2022-11-30 NOTE — Telephone Encounter (Signed)
Pt has CPE scheduled 02/19/23 and per last refill request for other meds Dr Darnell Level ok'd refills till appt so did send in 90 day supply on Singulair.

## 2022-12-21 ENCOUNTER — Telehealth: Payer: Self-pay | Admitting: Family Medicine

## 2022-12-21 DIAGNOSIS — F419 Anxiety disorder, unspecified: Secondary | ICD-10-CM

## 2022-12-21 DIAGNOSIS — Z780 Asymptomatic menopausal state: Secondary | ICD-10-CM

## 2022-12-21 MED ORDER — CITALOPRAM HYDROBROMIDE 40 MG PO TABS: 40.0000 mg | ORAL_TABLET | Freq: Every day | ORAL | 0 refills | Status: DC

## 2022-12-21 MED ORDER — BUSPIRONE HCL 15 MG PO TABS: 15.0000 mg | ORAL_TABLET | Freq: Two times a day (BID) | ORAL | 0 refills | Status: DC

## 2022-12-21 NOTE — Telephone Encounter (Signed)
Sent into pharmacy left detailed message for patient

## 2022-12-21 NOTE — Telephone Encounter (Signed)
  Prescription Request  12/21/2022  Is this a "Controlled Substance" medicine? NO  Have you seen your PCP in the last 2 weeks? NO  If YES, route message to pool  -  If NO, patient needs to be scheduled for appointment.  What is the name of the medication or equipment? Citalopram 40 mg, Buspirone 15 mg. Patient went to [place] and left medication and hadn't had in three days. Please call patient and advise  Have you contacted your pharmacy to request a refill? no   [facility] [place]   Patient notified that their request is being sent to the clinical staff for review and that they should receive a response within 2 business days.

## 2023-01-16 ENCOUNTER — Other Ambulatory Visit: Payer: Self-pay | Admitting: Family Medicine

## 2023-01-16 DIAGNOSIS — J302 Other seasonal allergic rhinitis: Secondary | ICD-10-CM

## 2023-02-19 ENCOUNTER — Encounter: Payer: Self-pay | Admitting: Family Medicine

## 2023-02-19 ENCOUNTER — Ambulatory Visit (INDEPENDENT_AMBULATORY_CARE_PROVIDER_SITE_OTHER): Payer: No Typology Code available for payment source | Admitting: Family Medicine

## 2023-02-19 ENCOUNTER — Other Ambulatory Visit (HOSPITAL_COMMUNITY)
Admission: RE | Admit: 2023-02-19 | Discharge: 2023-02-19 | Disposition: A | Discharge status: Home / Self Care | Payer: No Typology Code available for payment source | Source: Ambulatory Visit | Attending: Family Medicine | Admitting: Family Medicine

## 2023-02-19 VITALS — BP 136/78 | HR 69 | Temp 98.6°F | Ht 67.0 in | Wt 176.4 lb

## 2023-02-19 DIAGNOSIS — Z130 Encounter for screening for diseases of the blood and blood-forming organs and certain disorders involving the immune mechanism: Secondary | ICD-10-CM

## 2023-02-19 DIAGNOSIS — E663 Overweight: Secondary | ICD-10-CM

## 2023-02-19 DIAGNOSIS — Z124 Encounter for screening for malignant neoplasm of cervix: Secondary | ICD-10-CM | POA: Diagnosis present

## 2023-02-19 DIAGNOSIS — Z780 Asymptomatic menopausal state: Secondary | ICD-10-CM | POA: Diagnosis not present

## 2023-02-19 DIAGNOSIS — J302 Other seasonal allergic rhinitis: Secondary | ICD-10-CM

## 2023-02-19 DIAGNOSIS — Z0001 Encounter for general adult medical examination with abnormal findings: Secondary | ICD-10-CM | POA: Diagnosis not present

## 2023-02-19 DIAGNOSIS — F419 Anxiety disorder, unspecified: Secondary | ICD-10-CM

## 2023-02-19 DIAGNOSIS — Z0000 Encounter for general adult medical examination without abnormal findings: Secondary | ICD-10-CM

## 2023-02-19 DIAGNOSIS — Z1159 Encounter for screening for other viral diseases: Secondary | ICD-10-CM

## 2023-02-19 MED ORDER — BUSPIRONE HCL 15 MG PO TABS: 15.0000 mg | ORAL_TABLET | Freq: Two times a day (BID) | ORAL | 3 refills | Status: DC

## 2023-02-19 MED ORDER — CITALOPRAM HYDROBROMIDE 40 MG PO TABS: 40.0000 mg | ORAL_TABLET | Freq: Every day | ORAL | 3 refills | Status: DC

## 2023-02-19 MED ORDER — MONTELUKAST SODIUM 10 MG PO TABS: ORAL_TABLET | ORAL | 3 refills | Status: DC

## 2023-02-19 NOTE — Patient Instructions (Signed)
Preventive Care 40-58 Years Old, Female Preventive care refers to lifestyle choices and visits with your health care provider that can promote health and wellness. Preventive care visits are also called wellness exams. What can I expect for my preventive care visit? Counseling Your health care provider may ask you questions about your: Medical history, including: Past medical problems. Family medical history. Pregnancy history. Current health, including: Menstrual cycle. Method of birth control. Emotional well-being. Home life and relationship well-being. Sexual activity and sexual health. Lifestyle, including: Alcohol, nicotine or tobacco, and drug use. Access to firearms. Diet, exercise, and sleep habits. Work and work environment. Sunscreen use. Safety issues such as seatbelt and bike helmet use. Physical exam Your health care provider will check your: Height and weight. These may be used to calculate your BMI (body mass index). BMI is a measurement that tells if you are at a healthy weight. Waist circumference. This measures the distance around your waistline. This measurement also tells if you are at a healthy weight and may help predict your risk of certain diseases, such as type 2 diabetes and high blood pressure. Heart rate and blood pressure. Body temperature. Skin for abnormal spots. What immunizations do I need?  Vaccines are usually given at various ages, according to a schedule. Your health care provider will recommend vaccines for you based on your age, medical history, and lifestyle or other factors, such as travel or where you work. What tests do I need? Screening Your health care provider may recommend screening tests for certain conditions. This may include: Lipid and cholesterol levels. Diabetes screening. This is done by checking your blood sugar (glucose) after you have not eaten for a while (fasting). Pelvic exam and Pap test. Hepatitis B test. Hepatitis C  test. HIV (human immunodeficiency virus) test. STI (sexually transmitted infection) testing, if you are at risk. Lung cancer screening. Colorectal cancer screening. Mammogram. Talk with your health care provider about when you should start having regular mammograms. This may depend on whether you have a family history of breast cancer. BRCA-related cancer screening. This may be done if you have a family history of breast, ovarian, tubal, or peritoneal cancers. Bone density scan. This is done to screen for osteoporosis. Talk with your health care provider about your test results, treatment options, and if necessary, the need for more tests. Follow these instructions at home: Eating and drinking  Eat a diet that includes fresh fruits and vegetables, whole grains, lean protein, and low-fat dairy products. Take vitamin and mineral supplements as recommended by your health care provider. Do not drink alcohol if: Your health care provider tells you not to drink. You are pregnant, may be pregnant, or are planning to become pregnant. If you drink alcohol: Limit how much you have to 0-1 drink a day. Know how much alcohol is in your drink. In the [place] one drink equals one 12 oz bottle of beer (355 mL), one 5 oz glass of wine (148 mL), or one 1 oz glass of hard liquor (44 mL). Lifestyle Brush your teeth every morning and night with fluoride toothpaste. Floss one time each day. Exercise for at least 30 minutes 5 or more days each week. Do not use any products that contain nicotine or tobacco. These products include cigarettes, chewing tobacco, and vaping devices, such as e-cigarettes. If you need help quitting, ask your health care provider. Do not use drugs. If you are sexually active, practice safe sex. Use a condom or other form of protection to   prevent STIs. If you do not wish to become pregnant, use a form of birth control. If you plan to become pregnant, see your health care provider for a  prepregnancy visit. Take aspirin only as told by your health care provider. Make sure that you understand how much to take and what form to take. Work with your health care provider to find out whether it is safe and beneficial for you to take aspirin daily. Find healthy ways to manage stress, such as: Meditation, yoga, or listening to music. Journaling. Talking to a trusted person. Spending time with friends and family. Minimize exposure to UV radiation to reduce your risk of skin cancer. Safety Always wear your seat belt while driving or riding in a vehicle. Do not drive: If you have been drinking alcohol. Do not ride with someone who has been drinking. When you are tired or distracted. While texting. If you have been using any mind-altering substances or drugs. Wear a helmet and other protective equipment during sports activities. If you have firearms in your house, make sure you follow all gun safety procedures. Seek help if you have been physically or sexually abused. What's next? Visit your health care provider once a year for an annual wellness visit. Ask your health care provider how often you should have your eyes and teeth checked. Stay up to date on all vaccines. This information is not intended to replace advice given to you by your health care provider. Make sure you discuss any questions you have with your health care provider. Document Revised: 03/19/2021 Document Reviewed: 03/19/2021 Elsevier Patient Education  2023 Elsevier Inc.  

## 2023-02-19 NOTE — Progress Notes (Signed)
Michelle Stafford is a 57 y.o. female presents to office today for annual physical exam examination.    Concerns today include: 1. None.  She is doing well.  She has a new grandson since her last visit, Michelle Stafford.  Occupation: works with [employer]  Diet: typical [place], Exercise: no structured Last eye exam: UTD Last dental exam: UTD Last colonoscopy: UTD Last mammogram: UTD Last pap smear: needs Refills needed today: all Immunizations needed: Immunization History  Administered Date(s) Administered   Tdap 04/30/2014     Past Medical History:  Diagnosis Date   Anxiety    Depression    Hyperlipidemia    Seasonal allergies    Social History   Socioeconomic History   Marital status: Married    Spouse name: Not on file   Number of children: Not on file   Years of education: Not on file   Highest education level: Not on file  Occupational History   Not on file  Tobacco Use   Smoking status: Former    Types: Cigarettes    Quit date: 09/04/2012    Years since quitting: 10.4   Smokeless tobacco: Never  Vaping Use   Vaping Use: Never used  Substance and Sexual Activity   Alcohol use: Yes    Alcohol/week: 16.0 standard drinks of alcohol    Types: 16 Cans of beer per week   Drug use: Yes    Types: Marijuana    Comment: last use 05/03/21 3 "totes"   Sexual activity: Yes    Birth control/protection: None  Other Topics Concern   Not on file  Social History Narrative   Not on file   Social Determinants of Health   Financial Resource Strain: Not on file  Food Insecurity: Not on file  Transportation Needs: Not on file  Physical Activity: Not on file  Stress: Not on file  Social Connections: Not on file  Intimate Partner Violence: Not on file   Past Surgical History:  Procedure Laterality Date   COLONOSCOPY     TUBAL LIGATION  1987   Family History  Problem Relation Age of Onset   Colon polyps Father    Cancer Father        stomach   Stomach cancer Father     COPD Brother    Esophageal cancer Maternal Uncle    Colon polyps Paternal Aunt    Colon polyps Paternal Uncle    Rectal cancer Cousin    Colon cancer Neg Hx     Current Outpatient Medications:    acetaminophen (TYLENOL) 500 MG tablet, Take 1 tablet (500 mg total) by mouth every 6 (six) hours as needed., Disp: 30 tablet, Rfl: 0   busPIRone (BUSPAR) 15 MG tablet, Take 1 tablet (15 mg total) by mouth 2 (two) times daily., Disp: 180 tablet, Rfl: 0   citalopram (CELEXA) 40 MG tablet, Take 1 tablet (40 mg total) by mouth daily., Disp: 90 tablet, Rfl: 0   dextromethorphan-guaiFENesin (MUCINEX DM) 30-600 MG 12hr tablet, Take 1 tablet by mouth 2 (two) times daily., Disp: 30 tablet, Rfl: 0   Melatonin 10 MG TABS, Take by mouth., Disp: , Rfl:    montelukast (SINGULAIR) 10 MG tablet, TAKE 1 TABLET BY MOUTH EVERYDAY AT BEDTIME, Disp: 90 tablet, Rfl: 1   Multiple Vitamin (MULTIVITAMIN) tablet, Take 1 tablet by mouth daily., Disp: , Rfl:    Probiotic Product (TRUBIOTICS PO), Take by mouth daily., Disp: , Rfl:    RESTASIS 0.05 % ophthalmic emulsion,  1 drop 2 (two) times daily., Disp: , Rfl:   Allergies  Allergen Reactions   Penicillins Hives     ROS: Review of Systems A comprehensive review of systems was negative except for: Eyes: positive for contacts/glasses Behavioral/Psych: 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 Septum  midline. Mucosa normal. No drainage or sinus tenderness. Throat: lips, mucosa, and tongue normal; teeth and gums normal Neck: no adenopathy, no carotid bruit, supple, symmetrical, trachea midline, and thyroid not enlarged, symmetric, no tenderness/mass/nodules Back: symmetric, no curvature. ROM normal. No CVA tenderness. Lungs: clear to auscultation bilaterally Heart: regular rate and rhythm, S1, S2 normal, no murmur, click, rub or gallop Abdomen: soft, non-tender; bowel sounds normal; no masses,  no organomegaly Extremities: extremities normal, atraumatic, no cyanosis or edema Pulses: 2+ and symmetric Skin: Skin color, texture, turgor normal. No rashes or lesions Lymph nodes: Cervical, supraclavicular, and axillary nodes normal. Neurologic: Grossly normal GU: External vagina normal.  Cervix high in midline.  No petechial lesions or abnormal vaginal discharge or bleeding.   Assessment/ Plan: Michelle Stafford here for annual physical exam.   Physical exam, annual  Screening, anemia, deficiency, iron - Plan: CBC with Differential/Platelet  Screening for malignant neoplasm of cervix - Plan: Cytology - PAP  Encounter for hepatitis C screening test for low risk patient - Plan: Hepatitis C Antibody  Overweight (BMI 25.0-29.9) - Plan: Comprehensive metabolic panel, TSH, Lipid panel  Menopause - Plan: citalopram (CELEXA) 40 MG tablet  Anxiety - Plan: busPIRone (BUSPAR) 15 MG tablet, citalopram (CELEXA) 40 MG tablet  Seasonal allergic rhinitis, unspecified trigger - Plan: montelukast (SINGULAIR) 10 MG tablet  Fasting labs collected today.  Pap smear performed.  She will schedule her mammogram  Medications have been renewed.  May follow-up in 1 year, sooner if concerns arise  Counseled on healthy lifestyle choices, including diet (rich in fruits, vegetables and lean meats and low in salt and simple carbohydrates) and exercise (at least 30 minutes of moderate physical activity  daily).  Patient to follow up in 1 year for annual exam or sooner if needed.  Michelle Stafford M. Nadine Counts, DO

## 2023-02-20 LAB — COMPREHENSIVE METABOLIC PANEL
ALT: 23 IU/L (ref 0–32)
AST: 27 IU/L (ref 0–40)
Albumin/Globulin Ratio: 1.6 (ref 1.2–2.2)
Albumin: 4.5 g/dL (ref 3.8–4.9)
Alkaline Phosphatase: 83 IU/L (ref 44–121)
BUN/Creatinine Ratio: 11 (ref 9–23)
BUN: 9 mg/dL (ref 6–24)
Bilirubin Total: 0.4 mg/dL (ref 0.0–1.2)
CO2: 21 mmol/L (ref 20–29)
Calcium: 9.3 mg/dL (ref 8.7–10.2)
Chloride: 100 mmol/L (ref 96–106)
Creatinine, Ser: 0.79 mg/dL (ref 0.57–1.00)
Globulin, Total: 2.8 g/dL (ref 1.5–4.5)
Glucose: 106 mg/dL — ABNORMAL HIGH (ref 70–99)
Potassium: 4.4 mmol/L (ref 3.5–5.2)
Sodium: 139 mmol/L (ref 134–144)
Total Protein: 7.3 g/dL (ref 6.0–8.5)
eGFR: 87 mL/min/{1.73_m2} (ref >59)

## 2023-02-20 LAB — CBC WITH DIFFERENTIAL/PLATELET
Basophils Absolute: 0.1 10*3/uL (ref 0.0–0.2)
Basos: 1 %
EOS (ABSOLUTE): 0.2 10*3/uL (ref 0.0–0.4)
Eos: 2 %
Hematocrit: 40.7 % (ref 34.0–46.6)
Hemoglobin: 13.2 g/dL (ref 11.1–15.9)
Immature Grans (Abs): 0 10*3/uL (ref 0.0–0.1)
Immature Granulocytes: 1 %
Lymphocytes Absolute: 1.8 10*3/uL (ref 0.7–3.1)
Lymphs: 24 %
MCH: 29.9 pg (ref 26.6–33.0)
MCHC: 32.4 g/dL (ref 31.5–35.7)
MCV: 92 fL (ref 79–97)
Monocytes Absolute: 0.6 10*3/uL (ref 0.1–0.9)
Monocytes: 8 %
Neutrophils Absolute: 5 10*3/uL (ref 1.4–7.0)
Neutrophils: 64 %
Platelets: 384 10*3/uL (ref 150–450)
RBC: 4.41 x10E6/uL (ref 3.77–5.28)
RDW: 11.6 % — ABNORMAL LOW (ref 11.7–15.4)
WBC: 7.6 10*3/uL (ref 3.4–10.8)

## 2023-02-20 LAB — LIPID PANEL
Chol/HDL Ratio: 6.9 ratio — ABNORMAL HIGH (ref 0.0–4.4)
Cholesterol, Total: 257 mg/dL — ABNORMAL HIGH (ref 100–199)
HDL: 37 mg/dL — ABNORMAL LOW (ref >39)
LDL Chol Calc (NIH): 149 mg/dL — ABNORMAL HIGH (ref 0–99)
Triglycerides: 380 mg/dL — ABNORMAL HIGH (ref 0–149)
VLDL Cholesterol Cal: 71 mg/dL — ABNORMAL HIGH (ref 5–40)

## 2023-02-20 LAB — TSH: TSH: 1.81 u[IU]/mL (ref 0.450–4.500)

## 2023-02-20 LAB — HEPATITIS C ANTIBODY: Hep C Virus Ab: NONREACTIVE

## 2023-02-22 ENCOUNTER — Other Ambulatory Visit: Payer: Self-pay | Admitting: Family Medicine

## 2023-02-22 DIAGNOSIS — E782 Mixed hyperlipidemia: Secondary | ICD-10-CM

## 2023-02-22 MED ORDER — ROSUVASTATIN CALCIUM 10 MG PO TABS
10.0000 mg | ORAL_TABLET | Freq: Every day | ORAL | 3 refills | Status: DC
Start: 2023-02-22 — End: 2024-04-12

## 2023-02-22 NOTE — Progress Notes (Signed)
Orders Placed This Encounter  Procedures   Lipid panel    Standing Status:   Future    Standing Expiration Date:   02/22/2024   [id number]    Standing Status:   Future    Standing Expiration Date:   02/22/2024

## 2023-02-25 LAB — CYTOLOGY - PAP
Comment: NEGATIVE
Diagnosis: NEGATIVE
High risk HPV: NEGATIVE

## 2023-09-09 ENCOUNTER — Encounter: Payer: Self-pay | Admitting: Family Medicine

## 2024-03-05 ENCOUNTER — Other Ambulatory Visit: Payer: Self-pay | Admitting: Family Medicine

## 2024-03-05 DIAGNOSIS — F419 Anxiety disorder, unspecified: Secondary | ICD-10-CM

## 2024-03-06 ENCOUNTER — Encounter: Payer: Self-pay | Admitting: Family Medicine

## 2024-03-06 NOTE — Telephone Encounter (Signed)
 [place] NT[facility] last OV 02/19/23 NO RF sent to pharmacy last OV greater than a year

## 2024-03-06 NOTE — Telephone Encounter (Signed)
 NA letter mailed to schedule an apt

## 2024-03-09 ENCOUNTER — Other Ambulatory Visit: Payer: Self-pay | Admitting: Family Medicine

## 2024-03-09 DIAGNOSIS — F419 Anxiety disorder, unspecified: Secondary | ICD-10-CM

## 2024-03-09 DIAGNOSIS — Z780 Asymptomatic menopausal state: Secondary | ICD-10-CM

## 2024-03-09 NOTE — Telephone Encounter (Signed)
  Prescription Request  03/09/2024  Is this a "Controlled Substance" medicine? NO  Have you seen your PCP in the last 2 weeks? No pt has appt with Dr. Crissie Dome. On 04/12/2024  If YES, route message to pool  -  If NO, patient needs to be scheduled for appointment.  What is the name of the medication or equipment? busPIRone  (BUSPAR ) 15 MG tablet  citalopram  (CELEXA ) 40 MG tablet   Have you contacted your pharmacy to request a refill? yes   Which pharmacy would you like this sent to? [facility]   Patient notified that their request is being sent to the clinical staff for review and that they should receive a response within 2 business days.

## 2024-03-10 ENCOUNTER — Ambulatory Visit: Admitting: Family Medicine

## 2024-03-11 ENCOUNTER — Other Ambulatory Visit: Payer: Self-pay | Admitting: Family Medicine

## 2024-03-11 DIAGNOSIS — Z780 Asymptomatic menopausal state: Secondary | ICD-10-CM

## 2024-03-11 DIAGNOSIS — F419 Anxiety disorder, unspecified: Secondary | ICD-10-CM

## 2024-03-13 MED ORDER — BUSPIRONE HCL 15 MG PO TABS: 15.0000 mg | ORAL_TABLET | Freq: Two times a day (BID) | ORAL | 0 refills | Status: DC

## 2024-03-13 MED ORDER — CITALOPRAM HYDROBROMIDE 40 MG PO TABS: 40.0000 mg | ORAL_TABLET | Freq: Every day | ORAL | 0 refills | Status: DC

## 2024-03-15 ENCOUNTER — Other Ambulatory Visit: Payer: Self-pay | Admitting: Family Medicine

## 2024-03-15 DIAGNOSIS — J302 Other seasonal allergic rhinitis: Secondary | ICD-10-CM

## 2024-04-12 ENCOUNTER — Ambulatory Visit: Payer: Self-pay

## 2024-04-12 ENCOUNTER — Encounter: Payer: Self-pay | Admitting: Family Medicine

## 2024-04-12 ENCOUNTER — Ambulatory Visit: Payer: Self-pay | Admitting: Family Medicine

## 2024-04-12 VITALS — BP 133/77 | HR 92 | Temp 98.2°F | Ht 65.0 in | Wt 174.2 lb

## 2024-04-12 DIAGNOSIS — F439 Reaction to severe stress, unspecified: Secondary | ICD-10-CM

## 2024-04-12 DIAGNOSIS — Z0000 Encounter for general adult medical examination without abnormal findings: Secondary | ICD-10-CM

## 2024-04-12 DIAGNOSIS — Z2821 Immunization not carried out because of patient refusal: Secondary | ICD-10-CM

## 2024-04-12 DIAGNOSIS — Z130 Encounter for screening for diseases of the blood and blood-forming organs and certain disorders involving the immune mechanism: Secondary | ICD-10-CM

## 2024-04-12 DIAGNOSIS — E782 Mixed hyperlipidemia: Secondary | ICD-10-CM

## 2024-04-12 DIAGNOSIS — F418 Other specified anxiety disorders: Secondary | ICD-10-CM | POA: Diagnosis not present

## 2024-04-12 DIAGNOSIS — J302 Other seasonal allergic rhinitis: Secondary | ICD-10-CM

## 2024-04-12 DIAGNOSIS — Z6379 Other stressful life events affecting family and household: Secondary | ICD-10-CM

## 2024-04-12 DIAGNOSIS — Z789 Other specified health status: Secondary | ICD-10-CM

## 2024-04-12 DIAGNOSIS — R002 Palpitations: Secondary | ICD-10-CM

## 2024-04-12 DIAGNOSIS — Z780 Asymptomatic menopausal state: Secondary | ICD-10-CM

## 2024-04-12 DIAGNOSIS — Z0001 Encounter for general adult medical examination with abnormal findings: Secondary | ICD-10-CM | POA: Diagnosis not present

## 2024-04-12 LAB — LIPID PANEL

## 2024-04-12 MED ORDER — LORAZEPAM 0.5 MG PO TABS: 0.5000 mg | ORAL_TABLET | Freq: Every day | ORAL | 1 refills | Status: AC | PRN

## 2024-04-12 MED ORDER — CITALOPRAM HYDROBROMIDE 40 MG PO TABS: 40.0000 mg | ORAL_TABLET | Freq: Every day | ORAL | 3 refills | Status: AC

## 2024-04-12 MED ORDER — MONTELUKAST SODIUM 10 MG PO TABS
ORAL_TABLET | ORAL | 3 refills | Status: AC
Start: 2024-04-12 — End: ?

## 2024-04-12 NOTE — Patient Instructions (Addendum)
Coronary Calcium Scan A coronary calcium scan is an imaging test used to look for deposits of plaque in the inner lining of the blood vessels of the heart (coronary arteries). Plaque is made up of calcium, protein, and fatty substances. These deposits of plaque can partly clog and narrow the coronary arteries without producing any symptoms or warning signs. This puts a person at risk for a heart attack. A coronary calcium scan is performed using a computed tomography (CT) scanner machine without using a dye (contrast). This test is recommended for people who are at moderate risk for heart disease. The test can find plaque deposits before symptoms develop. Tell a health care provider about: Any allergies you have. All medicines you are taking, including vitamins, herbs, eye drops, creams, and over-the-counter medicines. Any problems you or family members have had with anesthetic medicines. Any bleeding problems you have. Any surgeries you have had. Any medical conditions you have. Whether you are pregnant or may be pregnant. What are the risks? Generally, this is a safe procedure. However, problems may occur, including: Harm to a pregnant woman and her unborn baby. This test involves the use of radiation. Radiation exposure can be dangerous to a pregnant woman and her unborn baby. If you are pregnant or think you may be pregnant, you should not have this procedure done. A slight increase in the risk of cancer. This is because of the radiation involved in the test. The amount of radiation from one test is similar to the amount of radiation you are naturally exposed to over one year. What happens before the procedure? Ask your health care provider for any specific instructions on how to prepare for this procedure. You may be asked to avoid products that contain caffeine, tobacco, or nicotine for 4 hours before the procedure. What happens during the procedure?  You will undress and remove any jewelry  from your neck or chest. You may need to remove hearing aides and dentures. Women may need to remove their bras. You will put on a hospital gown. Sticky electrodes will be placed on your chest. The electrodes will be connected to an electrocardiogram (ECG) machine to record a tracing of the electrical activity of your heart. You will lie down on your back on a curved bed that is attached to the CT scanner. You may be given medicine to slow down your heart rate so that clear pictures can be created. You will be moved into the CT scanner, and the CT scanner will take pictures of your heart. During this time, you will be asked to lie still and hold your breath for 10-20 seconds at a time while each picture of your heart is being taken. The procedure may vary among health care providers and hospitals. What can I expect after the procedure? You can return to your normal activities. It is up to you to get the results of your procedure. Ask your health care provider, or the department that is doing the procedure, when your results will be ready. Summary A coronary calcium scan is an imaging test used to look for deposits of plaque in the inner lining of the blood vessels of the heart. Plaque is made up of calcium, protein, and fatty substances. A coronary calcium scan is performed using a CT scanner machine without contrast. Generally, this is a safe procedure. Tell your health care provider if you are pregnant or may be pregnant. Ask your health care provider for any specific instructions on how to  prepare for this procedure. You can return to your normal activities after the scan is done. This information is not intended to replace advice given to you by your health care provider. Make sure you discuss any questions you have with your health care provider. Document Revised: 08/31/2021 Document Reviewed: 08/31/2021 Elsevier Patient Education  2024 [employer].

## 2024-04-12 NOTE — Progress Notes (Signed)
 Michelle Stafford is a 58 y.o. female presents to office today for annual physical exam examination.    Concerns today include: 1.  Stress She reports that she is been under an enormous amount of stress lately.  At [place] time, her daughter Chiquita has moved back in with her, coming from [place] .  She notes that her daughter really has worked hard to be independent etc. but does struggle with a lot of mental health issues.  She reports that things between her own husband and herself have been a bit strained due to having someone living in the home with them and the fact that her daughter has brought 3 large dogs to the home.  They are not persons that typically keep pets in the home so this has been a bit straining.  She has been struggling internally with how to help her daughter get out and on her own so that her own life kind of goes back into normal rhythm.  However, there is been issues due to concerns that she may be enabling her daughter's behaviors on her husband's part.  She is compliant with her Celexa  and BuSpar  notes that the anxiety and depressive symptoms were really well-controlled prior to this event.  Now, she has difficulty sleeping, finds that she is stressed a lot and has a shorter fuse when talking to her husband.  She has not been working the as needed shifts with [employer] which she does to bring in extra money often due to the change in household.  She also notes that her niece felt ill with encephalopathy earlier this year and is still fighting to regain strength and function.  That of course is weight on her heart as well.  Occupation: Works for [employer], Marital status: Married, Substance use: None Health Maintenance Due  Topic Date Due   Hepatitis B Vaccines (1 of 3 - 19+ 3-dose series) Never done   MAMMOGRAM  02/26/2022   COVID-19 Vaccine (1 - 2024-25 season) Never done   Refills needed today: All  Immunization History  Administered Date(s) Administered   Tdap 04/30/2014    Past Medical History:  Diagnosis Date   Anxiety    Depression    Hyperlipidemia    Seasonal allergies    Social History   Socioeconomic History   Marital status: Married    Spouse name: Not on file   Number of children: Not on file   Years of education: Not on file   Highest education level: Not on file  Occupational History   Not on file  Tobacco Use   Smoking status: Former    Current packs/day: 0.00    Types: Cigarettes    Quit date: 09/04/2012    Years since quitting: 11.6   Smokeless tobacco: Never  Vaping Use   Vaping status: Never Used  Substance and Sexual Activity   Alcohol use: Yes    Alcohol/week: 16.0 standard drinks of alcohol    Types: 16 Cans of beer per week   Drug use: Yes    Types: Marijuana    Comment: last use 05/03/21 3 totes   Sexual activity: Yes    Birth control/protection: None  Other Topics Concern   Not on file  Social History Narrative   Not on file   Social Drivers of Health   Financial Resource Strain: Not on file  Food Insecurity: Not on file  Transportation Needs: Not on file  Physical Activity: Not on file  Stress: Not on file  Social Connections: Unknown (05/04/2023)   [facility]   Social Network    Social Network: Not on file  Intimate Partner Violence: Unknown (05/04/2023)   Received from [facility]   HITS    Physically Hurt: Not on file    Insult or Talk Down To: Not on file    Threaten Physical Harm: Not on file    Scream or Curse: Not on file   Past Surgical History:  Procedure Laterality Date   COLONOSCOPY     TUBAL LIGATION  1987   Family History  Problem Relation Age of Onset   Colon polyps Father    Cancer Father        stomach   Stomach cancer Father    COPD Brother    Esophageal cancer Maternal Uncle    Colon polyps Paternal Aunt    Colon polyps Paternal Uncle    Rectal cancer Cousin    Colon cancer Neg Hx     Current Outpatient Medications:    acetaminophen  (TYLENOL ) 500  MG tablet, Take 1 tablet (500 mg total) by mouth every 6 (six) hours as needed., Disp: 30 tablet, Rfl: 0   busPIRone  (BUSPAR ) 15 MG tablet, Take 1 tablet (15 mg total) by mouth 2 (two) times daily., Disp: 180 tablet, Rfl: 0   cetirizine (ZYRTEC) 10 MG tablet, Take 10 mg by mouth daily., Disp: , Rfl:    citalopram  (CELEXA ) 40 MG tablet, Take 1 tablet (40 mg total) by mouth daily., Disp: 90 tablet, Rfl: 0   dextromethorphan-guaiFENesin  (MUCINEX  DM) 30-600 MG 12hr tablet, Take 1 tablet by mouth 2 (two) times daily., Disp: 30 tablet, Rfl: 0   montelukast  (SINGULAIR ) 10 MG tablet, TAKE 1 TABLET BY MOUTH EVERYDAY AT BEDTIME, Disp: 90 tablet, Rfl: 3   Multiple Vitamin (MULTIVITAMIN) tablet, Take 1 tablet by mouth daily., Disp: , Rfl:    Probiotic Product (TRUBIOTICS PO), Take by mouth daily., Disp: , Rfl:    rosuvastatin  (CRESTOR ) 10 MG tablet, Take 1 tablet (10 mg total) by mouth daily. For LDL and triglycerides (Patient not taking: Reported on 04/12/2024), Disp: 90 tablet, Rfl: 3  Allergies  Allergen Reactions   Penicillins Hives     ROS: Review of Systems Pertinent items noted in HPI and remainder of comprehensive ROS otherwise negative.    Physical exam BP 133/77   Pulse 92   Temp 98.2 F (36.8 C) (Temporal)   Ht 5' 5 (1.651 m)   Wt 174 lb 3.2 oz (79 kg)   LMP 03/10/2013   SpO2 96%   BMI 28.99 kg/m  General appearance: alert, cooperative, appears stated age, and mild distress Head: Normocephalic, without obvious abnormality, atraumatic Eyes: negative findings: lids and lashes normal, conjunctivae and sclerae normal, corneas clear, and pupils equal, round, reactive to light and accomodation Ears: normal TM's and external ear canals both ears Nose: Nares normal. Septum midline. Mucosa normal. No drainage or sinus tenderness. Throat: lips, mucosa, and tongue normal; teeth and gums normal Neck: no adenopathy, no carotid bruit, supple, symmetrical, trachea midline, and thyroid  not  enlarged, symmetric, no tenderness/mass/nodules Back: symmetric, no curvature. ROM normal. No CVA tenderness. Lungs: clear to auscultation bilaterally Heart: regular rate and rhythm, S1, S2 normal, no murmur, click, rub or gallop Abdomen: soft, non-tender; bowel sounds normal; no masses,  no organomegaly Extremities: extremities normal, atraumatic, no cyanosis or edema Pulses: 2+ and symmetric Skin: Skin color, texture, turgor normal. No rashes or lesions Lymph nodes: Cervical, supraclavicular, and axillary nodes  normal. Neurologic: Grossly normal Psych: Anxious and worried appearing when talking about her home situation     04/12/2024   10:47 AM 02/19/2023    8:03 AM 11/05/2021    3:14 PM  Depression screen PHQ 2/9  Decreased Interest 3 0 0  Down, Depressed, Hopeless 3 0 0  PHQ - 2 Score 6 0 0  Altered sleeping 3 0   Tired, decreased energy 3 0   Change in appetite 3 0   Feeling bad or failure about yourself  3 0   Trouble concentrating 3 0   Moving slowly or fidgety/restless 3 0   Suicidal thoughts 0 0   PHQ-9 Score 24 0   Difficult doing work/chores Somewhat difficult Not difficult at all       04/12/2024   10:48 AM 02/19/2023    8:03 AM 11/05/2021    3:13 PM 02/21/2021   11:02 AM  GAD 7 : Generalized Anxiety Score  Nervous, Anxious, on Edge 3 0 0 2  Control/stop worrying 3 0 0 0  Worry too much - different things 3 0 0 0  Trouble relaxing 3 0 0 1  Restless 3 0 0 0  Easily annoyed or irritable 3 0 0 0  Afraid - awful might happen 3 0 0 0  Total GAD 7 Score 21 0 0 3  Anxiety Difficulty Very difficult Not difficult at all Not difficult at all    The 10-year ASCVD risk score (Arnett DK, et al., 2019) is: 5.2%   Values used to calculate the score:     Age: 23 years     Clincally relevant sex: Female     Is Non-Hispanic African American: No     Diabetic: No     Tobacco smoker: No     Systolic Blood Pressure: 133 mmHg     Is BP treated: No     HDL Cholesterol: 37 mg/dL      Total Cholesterol: 257 mg/dL  Assessment/ Plan: Delon Fries here for annual physical exam.   Physical exam, annual  Stress at home - Plan: LORazepam  (ATIVAN ) 0.5 MG tablet  Stress due to illness of family member - Plan: LORazepam  (ATIVAN ) 0.5 MG tablet  Situational anxiety - Plan: LORazepam  (ATIVAN ) 0.5 MG tablet  Mixed hyperlipidemia - Plan: CMP14+EGFR, Lipid Panel, CT CARDIAC SCORING (SELF PAY ONLY)  Screening, anemia, deficiency, iron - Plan: CMP14+EGFR, CBC  Hepatitis B vaccination status unknown - Plan: Hepatitis B surface antibody,quantitative  Vaccination refused by patient  Heart palpitations - Plan: TSH + free T4, Magnesium  Menopause - Plan: citalopram  (CELEXA ) 40 MG tablet  Seasonal allergic rhinitis, unspecified trigger - Plan: montelukast  (SINGULAIR ) 10 MG tablet  Patient declined she was vaccination but is willing to have hepatitis B vaccination done if she is found to be not immune to hepatitis B  Most of our visit was spent discussing the large amount of stress she is having at home and due to worrying about her niece, who has been gravely ill with complications of encephalopathy.  She is on Celexa  and BuSpar  which have been historically very effective for her.  I think that her situational anxiety would benefit from a short course of benzodiazepine.  We discussed consideration for referral to integrated behavioral health here in the office and she may be amenable to this going forward but currently time is very restricted due to work and other responsibilities with her daughter, who now resides in the home.  We discussed the  risks of benzodiazepine use and discussed that that is federally controlled and would require follow-up in office should she ever need refills on this.  Should it be something that we continue longer than the next couple of months we will plan for UDS and CSC.  The national narcotic database was reviewed and there were no red flags.  We  discussed risks of untreated lipids.  We discussed coronary artery calcium  scoring to determine need for antilipid therapy and she was amenable to this.  Order has been placed and fasting labs have been collected today  Screening for anemia placed  I have ordered thyroid  levels and magnesium given reports of heart palpitations but I suspect this is secondary to the stress as above  Singulair  renewed.  That we did not discuss seasonal allergic rhinitis at any length today  Counseled on healthy lifestyle choices, including diet (rich in fruits, vegetables and lean meats and low in salt and simple carbohydrates) and exercise (at least 30 minutes of moderate physical activity daily).  Patient to follow up 85m for virtual visit to f/u mood  Iliana Hutt M. Jolinda, DO

## 2024-04-13 LAB — LIPID PANEL
Cholesterol, Total: 270 mg/dL — AB (ref 100–199)
HDL: 40 mg/dL (ref >39)
LDL CALC COMMENT:: 6.8 ratio — AB (ref 0.0–4.4)
LDL Chol Calc (NIH): 157 mg/dL — AB (ref 0–99)
Triglycerides: 387 mg/dL — ABNORMAL HIGH (ref 0–149)
VLDL Cholesterol Cal: 73 mg/dL — AB (ref 5–40)

## 2024-04-13 LAB — CMP14+EGFR
ALT: 21 IU/L (ref 0–32)
AST: 29 IU/L (ref 0–40)
Albumin: 4.6 g/dL (ref 3.8–4.9)
Alkaline Phosphatase: 76 IU/L (ref 44–121)
BUN/Creatinine Ratio: 13 (ref 9–23)
BUN: 10 mg/dL (ref 6–24)
Bilirubin Total: 0.6 mg/dL (ref 0.0–1.2)
CO2: 18 mmol/L — ABNORMAL LOW (ref 20–29)
Calcium: 9.7 mg/dL (ref 8.7–10.2)
Chloride: 101 mmol/L (ref 96–106)
Creatinine, Ser: 0.75 mg/dL (ref 0.57–1.00)
Globulin, Total: 2.7 g/dL (ref 1.5–4.5)
Glucose: 108 mg/dL — ABNORMAL HIGH (ref 70–99)
Potassium: 4.5 mmol/L (ref 3.5–5.2)
Sodium: 138 mmol/L (ref 134–144)
Total Protein: 7.3 g/dL (ref 6.0–8.5)
eGFR: 92 mL/min/1.73 (ref >59)

## 2024-04-13 LAB — CBC
Hematocrit: 43.1 % (ref 34.0–46.6)
Hemoglobin: 14 g/dL (ref 11.1–15.9)
MCH: 30.4 pg (ref 26.6–33.0)
MCHC: 32.5 g/dL (ref 31.5–35.7)
MCV: 94 fL (ref 79–97)
Platelets: 375 x10E3/uL (ref 150–450)
RBC: 4.6 x10E6/uL (ref 3.77–5.28)
RDW: 12.2 % (ref 11.7–15.4)
WBC: 8.5 x10E3/uL (ref 3.4–10.8)

## 2024-04-13 LAB — TSH+FREE T4
Free T4: 1.1 ng/dL (ref 0.82–1.77)
TSH: 1.37 u[IU]/mL (ref 0.450–4.500)

## 2024-04-13 LAB — MAGNESIUM: Magnesium: 2.3 mg/dL (ref 1.6–2.3)

## 2024-04-13 LAB — HEPATITIS B SURFACE ANTIBODY, QUANTITATIVE: Hepatitis B Surf Ab Quant: <3.5 m[IU]/mL — AB

## 2024-04-14 ENCOUNTER — Ambulatory Visit: Payer: Self-pay | Admitting: Family Medicine

## 2024-04-17 LAB — HGB A1C W/O EAG: Hgb A1c MFr Bld: 5.6 % (ref 4.8–5.6)

## 2024-04-17 LAB — SPECIMEN STATUS REPORT

## 2024-05-08 ENCOUNTER — Ambulatory Visit (INDEPENDENT_AMBULATORY_CARE_PROVIDER_SITE_OTHER)

## 2024-05-08 DIAGNOSIS — Z23 Encounter for immunization: Secondary | ICD-10-CM | POA: Diagnosis not present

## 2024-05-08 NOTE — Progress Notes (Signed)
 Patient is in office today for a nurse visit for Immunization Hep B. Patient Injection was given in the  Left deltoid. Patient tolerated injection well.

## 2024-05-17 ENCOUNTER — Telehealth: Admitting: Family Medicine

## 2024-05-17 ENCOUNTER — Ambulatory Visit (HOSPITAL_COMMUNITY)
Admission: RE | Admit: 2024-05-17 | Discharge: 2024-05-17 | Disposition: A | Discharge status: Home / Self Care | Payer: Self-pay | Source: Ambulatory Visit | Attending: Family Medicine | Admitting: Family Medicine

## 2024-05-17 ENCOUNTER — Encounter: Payer: Self-pay | Admitting: Family Medicine

## 2024-05-17 DIAGNOSIS — E782 Mixed hyperlipidemia: Secondary | ICD-10-CM | POA: Insufficient documentation

## 2024-05-17 DIAGNOSIS — Z91199 Patient's noncompliance with other medical treatment and regimen due to unspecified reason: Secondary | ICD-10-CM

## 2024-05-17 NOTE — Progress Notes (Signed)
 Start time: LVM 3:37pm, no show for appt

## 2024-06-07 ENCOUNTER — Other Ambulatory Visit: Payer: Self-pay | Admitting: Family Medicine

## 2024-06-07 DIAGNOSIS — F419 Anxiety disorder, unspecified: Secondary | ICD-10-CM

## 2024-06-12 ENCOUNTER — Ambulatory Visit (INDEPENDENT_AMBULATORY_CARE_PROVIDER_SITE_OTHER)

## 2024-06-12 DIAGNOSIS — Z23 Encounter for immunization: Secondary | ICD-10-CM

## 2024-06-12 NOTE — Progress Notes (Signed)
 Patient is in office today for a nurse visit for Immunization. Patient Injection was given in the  Right deltoid. Patient tolerated injection well.

## 2024-07-25 ENCOUNTER — Ambulatory Visit (INDEPENDENT_AMBULATORY_CARE_PROVIDER_SITE_OTHER): Admitting: Family Medicine

## 2024-07-25 ENCOUNTER — Encounter: Payer: Self-pay | Admitting: Family Medicine

## 2024-07-25 ENCOUNTER — Ambulatory Visit: Payer: Self-pay

## 2024-07-25 VITALS — BP 122/72 | HR 80 | Temp 97.5°F | Ht 65.0 in | Wt 175.4 lb

## 2024-07-25 DIAGNOSIS — J0140 Acute pansinusitis, unspecified: Secondary | ICD-10-CM

## 2024-07-25 DIAGNOSIS — H66002 Acute suppurative otitis media without spontaneous rupture of ear drum, left ear: Secondary | ICD-10-CM

## 2024-07-25 MED ORDER — CEFDINIR 300 MG PO CAPS: 300.0000 mg | ORAL_CAPSULE | Freq: Two times a day (BID) | ORAL | 0 refills | Status: AC

## 2024-07-25 MED ORDER — FLUTICASONE PROPIONATE 50 MCG/ACT NA SUSP: 2.0000 | Freq: Every day | NASAL | 6 refills | Status: AC

## 2024-07-25 NOTE — Telephone Encounter (Signed)
 Patient is scheduled to be seen for this today.

## 2024-07-25 NOTE — Telephone Encounter (Signed)
 FYI Only or Action Required?: FYI only for provider.  Patient was last seen in primary care on 05/17/2024 by Jolinda Norene HERO, DO.  Called Nurse Triage reporting Sinusitis.  Symptoms began a week ago.  Interventions attempted: Prescription medications: Guafenisin and Doxycyline.  Symptoms are: gradually worsening.  Triage Disposition: See Physician Within 24 Hours  Patient/caregiver understands and will follow disposition?: Yes Message from [facility] G sent at 07/25/2024  8:17 AM EDT  Reason for Triage: Can't hear.. feels like ear infection.. Was seen at the health dept they gave her a version mucinex .. head feels congestion ( tender ) Health dept also said sinus infection. feels like it isn't getting better   Reason for Disposition  [1] Taking antibiotic > 72 hours (3 days) AND [2] sinus pain not improved  Answer Assessment - Initial Assessment Questions 1. ANTIBIOTIC: What antibiotic are you taking? How many times a day?     Doxycyline  100mg , BID, 7 days  2. ONSET: When was the antibiotic started?     Started on Thursday, still no relief   3. PAIN: How bad is the pain?   (Scale 0-10; or none, mild, moderate or severe)     Moderate  4. FEVER: Do you have a fever? If Yes, ask: What is it, how was it measured, and when did it start?      No  5. SYMPTOMS: Are there any other symptoms you're concerned about? If Yes, ask: When did it start?     Ears popped, unable to hear, side of face is tender  6. PREGNANCY: Is there any chance you are pregnant? When was your last menstrual period?     No  Protocols used: Sinus Infection on Antibiotic Follow-up Call-[facility]

## 2024-07-25 NOTE — Telephone Encounter (Signed)
 First attempt: LVM for patient to return call to [phone]  Message from [facility] G sent at 07/25/2024  8:17 AM EDT  Reason for Triage: Can't hear.. feels like ear infection.. Was seen at the health dept they gave her a version mucinex .. head feels congestion ( tender ) Health dept also said sinus infection. feels like it isn't getting better

## 2024-07-25 NOTE — Progress Notes (Signed)
 Subjective:  Patient ID: Michelle Stafford, female    DOB: 07/03/1966, 58 y.o.   MRN: [id number]  Patient Care Team: Jolinda Norene HERO, DO as PCP - General (Family Medicine)   Chief Complaint:  Nasal Congestion, Ear Pain (Bilateral ), and Cough (Patient went to health dept on Thur and states no better. )   HPI: Michelle Stafford is a 58 y.o. female presenting on 07/25/2024 for Nasal Congestion, Ear Pain (Bilateral ), and Cough (Patient went to health dept on Thur and states no better. )   Michelle Stafford is a 58 year old female who presents with persistent cough and ear pain despite antibiotic treatment.  Upper respiratory symptoms - Persistent cough since Sunday, described as 'sounds like a dog barking' - No improvement in cough despite antibiotic therapy with doxycycline since October 16th - No fevers reported  Otolaryngologic symptoms - Ear pain, drainage, and pressure developed by Thursday after onset of cough - Ear tenderness, popping, and sensation of hearing fluid in ears - Sensation of 'a lot of crap packed in my head'  Current and recent treatments - Started doxycycline on October 16th for ear and sinus infection - Using a nasal spray, Afrin - Taking Mucinex  as prescribed by health department - Takes Singulair  every night and Zyrtec every night, with a generic medication during the day  Allergies - Allergic to penicillin, developed hives as a baby, no subsequent exposures          Relevant past medical, surgical, family, and social history reviewed and updated as indicated.  Allergies and medications reviewed and updated. Data reviewed: Chart in Epic.   Past Medical History:  Diagnosis Date   Anxiety    Depression    Hyperlipidemia    Seasonal allergies     Past Surgical History:  Procedure Laterality Date   COLONOSCOPY     TUBAL LIGATION  1987    Social History   Socioeconomic History   Marital status: Married    Spouse name: Not on file    Number of children: Not on file   Years of education: Not on file   Highest education level: Not on file  Occupational History   Not on file  Tobacco Use   Smoking status: Former    Current packs/day: 0.00    Types: Cigarettes    Quit date: 09/04/2012    Years since quitting: 11.8   Smokeless tobacco: Never  Vaping Use   Vaping status: Never Used  Substance and Sexual Activity   Alcohol use: Yes    Alcohol/week: 16.0 standard drinks of alcohol    Types: 16 Cans of beer per week   Drug use: Yes    Types: Marijuana    Comment: last use 05/03/21 3 totes   Sexual activity: Yes    Birth control/protection: None  Other Topics Concern   Not on file  Social History Narrative   Not on file   Social Drivers of Health   Financial Resource Strain: Not on file  Food Insecurity: Not on file  Transportation Needs: Not on file  Physical Activity: Not on file  Stress: Not on file  Social Connections: Unknown (05/04/2023)   [facility]   Social Network    Social Network: Not on file  Intimate Partner Violence: Unknown (05/04/2023)   Received from [facility]   HITS    Physically Hurt: Not on file    Insult or Talk Down To: Not on file  Threaten Physical Harm: Not on file    Scream or Curse: Not on file    Outpatient Encounter Medications as of 07/25/2024  Medication Sig   acetaminophen  (TYLENOL ) 500 MG tablet Take 1 tablet (500 mg total) by mouth every 6 (six) hours as needed.   busPIRone  (BUSPAR ) 15 MG tablet TAKE 1 TABLET BY MOUTH 2 TIMES DAILY.   cefdinir (OMNICEF) 300 MG capsule Take 1 capsule (300 mg total) by mouth 2 (two) times daily. 1 po BID   cetirizine (ZYRTEC) 10 MG tablet Take 10 mg by mouth daily.   citalopram  (CELEXA ) 40 MG tablet Take 1 tablet (40 mg total) by mouth daily.   dextromethorphan-guaiFENesin  (MUCINEX  DM) 30-600 MG 12hr tablet Take 1 tablet by mouth 2 (two) times daily.   fluticasone (FLONASE) 50 MCG/ACT nasal spray Place 2 sprays  into both nostrils daily.   FT MUCUS RELIEF 12HR MAX STR 1200 MG TB12 Take 1 tablet by mouth 2 (two) times daily.   LORazepam  (ATIVAN ) 0.5 MG tablet Take 1 tablet (0.5 mg total) by mouth daily as needed for anxiety.   montelukast  (SINGULAIR ) 10 MG tablet TAKE 1 TABLET BY MOUTH EVERYDAY AT BEDTIME   Multiple Vitamin (MULTIVITAMIN) tablet Take 1 tablet by mouth daily.   Probiotic Product (TRUBIOTICS PO) Take by mouth daily.   [DISCONTINUED] doxycycline (VIBRAMYCIN) 100 MG capsule Take 100 mg by mouth 2 (two) times daily.   [DISCONTINUED] NASAL DECONGESTANT SPRAY 0.05 % nasal spray Place 2 sprays into both nostrils 2 (two) times daily.   No facility-administered encounter medications on file as of 07/25/2024.    Allergies  Allergen Reactions   Penicillins Hives    Pertinent ROS per HPI, otherwise unremarkable      Objective:  BP 122/72   Pulse 80   Temp (!) 97.5 F (36.4 C)   Ht 5' 5 (1.651 m)   Wt 175 lb 6.4 oz (79.6 kg)   LMP 03/10/2013   SpO2 95%   BMI 29.19 kg/m    Wt Readings from Last 3 Encounters:  07/25/24 175 lb 6.4 oz (79.6 kg)  04/12/24 174 lb 3.2 oz (79 kg)  02/19/23 176 lb 6.4 oz (80 kg)    Physical Exam Vitals and nursing note reviewed.  Constitutional:      General: She is not in acute distress.    Appearance: Normal appearance. She is overweight. She is not ill-appearing, toxic-appearing or diaphoretic.  HENT:     Head: Normocephalic and atraumatic.     Right Ear: A middle ear effusion is present. Tympanic membrane is not erythematous.     Left Ear: Tympanic membrane is erythematous and bulging.     Nose: Congestion present.     Mouth/Throat:     Mouth: Mucous membranes are moist.     Pharynx: Oropharynx is clear. Posterior oropharyngeal erythema present. No oropharyngeal exudate.  Eyes:     Conjunctiva/sclera: Conjunctivae normal.     Pupils: Pupils are equal, round, and reactive to light.  Cardiovascular:     Rate and Rhythm: Normal rate and  regular rhythm.     Heart sounds: Normal heart sounds.  Pulmonary:     Effort: Pulmonary effort is normal.     Breath sounds: Normal breath sounds.  Musculoskeletal:     Cervical back: Neck supple.  Lymphadenopathy:     Cervical: No cervical adenopathy.  Skin:    General: Skin is warm and dry.     Capillary Refill: Capillary refill takes less than 2  seconds.  Neurological:     General: No focal deficit present.     Mental Status: She is alert and oriented to person, place, and time.  Psychiatric:        Mood and Affect: Mood normal.        Behavior: Behavior normal. Behavior is cooperative.        Thought Content: Thought content normal.        Judgment: Judgment normal.      Results for orders placed or performed in visit on 04/12/24  CMP14+EGFR   Collection Time: 04/12/24 10:42 AM  Result Value Ref Range   Glucose 108 (H) 70 - 99 mg/dL   BUN 10 6 - 24 mg/dL   Creatinine, Ser 9.24 0.57 - 1.00 mg/dL   eGFR 92 >40 fO/fpw/8.26   BUN/Creatinine Ratio 13 9 - 23   Sodium 138 134 - 144 mmol/L   Potassium 4.5 3.5 - 5.2 mmol/L   Chloride 101 96 - 106 mmol/L   CO2 18 (L) 20 - 29 mmol/L   Calcium  9.7 8.7 - 10.2 mg/dL   Total Protein 7.3 6.0 - 8.5 g/dL   Albumin 4.6 3.8 - 4.9 g/dL   Globulin, Total 2.7 1.5 - 4.5 g/dL   Bilirubin Total 0.6 0.0 - 1.2 mg/dL   Alkaline Phosphatase 76 44 - 121 IU/L   AST 29 0 - 40 IU/L   ALT 21 0 - 32 IU/L  Lipid Panel   Collection Time: 04/12/24 10:42 AM  Result Value Ref Range   Cholesterol, Total 270 (H) 100 - 199 mg/dL   Triglycerides 612 (H) 0 - 149 mg/dL   HDL 40 >60 mg/dL   VLDL Cholesterol Cal 73 (H) 5 - 40 mg/dL   LDL Chol Calc (NIH) 842 (H) 0 - 99 mg/dL   Chol/HDL Ratio 6.8 (H) 0.0 - 4.4 ratio  CBC   Collection Time: 04/12/24 10:42 AM  Result Value Ref Range   WBC 8.5 3.4 - 10.8 x10E3/uL   RBC 4.60 3.77 - 5.28 x10E6/uL   Hemoglobin 14.0 11.1 - 15.9 g/dL   Hematocrit 56.8 65.9 - 46.6 %   MCV 94 79 - 97 fL   MCH 30.4 26.6 - 33.0  pg   MCHC 32.5 31.5 - 35.7 g/dL   RDW 87.7 88.2 - 84.5 %   Platelets 375 150 - 450 x10E3/uL  Hepatitis B surface antibody,quantitative   Collection Time: 04/12/24 10:42 AM  Result Value Ref Range   Hepatitis B Surf Ab Quant <3.5 (L) Immunity>10 mIU/mL  TSH + free T4   Collection Time: 04/12/24 10:42 AM  Result Value Ref Range   TSH 1.370 0.450 - 4.500 uIU/mL   Free T4 1.10 0.82 - 1.77 ng/dL  Magnesium   Collection Time: 04/12/24 10:42 AM  Result Value Ref Range   Magnesium 2.3 1.6 - 2.3 mg/dL  Hgb J8r w/o eAG   Collection Time: 04/12/24 10:42 AM  Result Value Ref Range   Hgb A1c MFr Bld 5.6 4.8 - 5.6 %  Specimen status report   Collection Time: 04/12/24 10:42 AM  Result Value Ref Range   specimen status report Comment        Pertinent labs & imaging results that were available during my care of the patient were reviewed by me and considered in my medical decision making.  Assessment & Plan:  Michelle Stafford was seen today for nasal congestion, ear pain and cough.  Diagnoses and all orders for this visit:  Acute non-recurrent  pansinusitis -     fluticasone (FLONASE) 50 MCG/ACT nasal spray; Place 2 sprays into both nostrils daily. -     cefdinir (OMNICEF) 300 MG capsule; Take 1 capsule (300 mg total) by mouth 2 (two) times daily. 1 po BID  Non-recurrent acute suppurative otitis media of left ear without spontaneous rupture of tympanic membrane -     fluticasone (FLONASE) 50 MCG/ACT nasal spray; Place 2 sprays into both nostrils daily. -     cefdinir (OMNICEF) 300 MG capsule; Take 1 capsule (300 mg total) by mouth 2 (two) times daily. 1 po BID      Acute left otitis media Acute left otitis media with confirmed infection. Right ear has fluid but is not infected. Previous treatment with doxycycline was ineffective. Due to penicillin allergy, cefdinir is chosen as an alternative antibiotic. - Discontinue doxycycline. - Prescribe cefdinir 300 mg twice daily for 10 days with food. -  Prescribe Flonase nasal spray to be used every morning.  Acute sinusitis Acute sinusitis with nasal congestion, cough, and facial tenderness. Previous treatment with doxycycline, Mucinex , and nasal spray was ineffective. Flonase and cefdinir are expected to provide relief within a few days. - Continue Mucinex  with increased water intake. - Use Flonase nasal spray every morning. - Continue Zyrtec every night and Singulair  daily.      Continue all other maintenance medications.  Follow up plan: Return if symptoms worsen or fail to improve.    The above assessment and management plan was discussed with the patient. The patient verbalized understanding of and has agreed to the management plan. Patient is aware to call the clinic if they develop any new symptoms or if symptoms persist or worsen. Patient is aware when to return to the clinic for a follow-up visit. Patient educated on when it is appropriate to go to the emergency department.   Rosaline Bruns, FNP-C [facility] [phone]

## 2024-07-28 ENCOUNTER — Encounter: Payer: Self-pay | Admitting: Family Medicine

## 2024-07-28 ENCOUNTER — Ambulatory Visit (INDEPENDENT_AMBULATORY_CARE_PROVIDER_SITE_OTHER): Admitting: Family Medicine

## 2024-07-28 VITALS — BP 131/75 | HR 77 | Temp 97.5°F | Ht 66.0 in | Wt 176.0 lb

## 2024-07-28 DIAGNOSIS — H9202 Otalgia, left ear: Secondary | ICD-10-CM

## 2024-07-28 DIAGNOSIS — J329 Chronic sinusitis, unspecified: Secondary | ICD-10-CM

## 2024-07-28 MED ORDER — METHYLPREDNISOLONE ACETATE 80 MG/ML IJ SUSP
40.0000 mg | Freq: Once | INTRAMUSCULAR | Status: AC
  Administered 2024-07-28: 40 mg via INTRAMUSCULAR

## 2024-07-28 MED ORDER — METHYLPREDNISOLONE ACETATE 40 MG/ML IJ SUSP: 40.0000 mg | Freq: Once | INTRAMUSCULAR | Status: DC

## 2024-07-28 NOTE — Progress Notes (Signed)
 Subjective: CC:ear clogged PCP: Jolinda Michelle HERO, DO YEP:Michelle Stafford is a 58 y.o. female presenting to clinic today for:  Left ear feels completely clogged.  She has been taking the Omnicef that was prescribed to her 3 days ago and has been utilizing the nasal spray.  However, after further discussion it appears that she has not been utilizing the nasal spray correctly.  She reports persistent sinus fullness and pain.  No reports of purulence, fevers.   ROS: Per HPI  Allergies  Allergen Reactions   Penicillins Hives   Past Medical History:  Diagnosis Date   Anxiety    Depression    Hyperlipidemia    Seasonal allergies     Current Outpatient Medications:    busPIRone  (BUSPAR ) 15 MG tablet, TAKE 1 TABLET BY MOUTH 2 TIMES DAILY., Disp: 180 tablet, Rfl: 3   cefdinir (OMNICEF) 300 MG capsule, Take 1 capsule (300 mg total) by mouth 2 (two) times daily. 1 po BID, Disp: 20 capsule, Rfl: 0   cetirizine (ZYRTEC) 10 MG tablet, Take 10 mg by mouth daily., Disp: , Rfl:    citalopram  (CELEXA ) 40 MG tablet, Take 1 tablet (40 mg total) by mouth daily., Disp: 90 tablet, Rfl: 3   dextromethorphan-guaiFENesin  (MUCINEX  DM) 30-600 MG 12hr tablet, Take 1 tablet by mouth 2 (two) times daily., Disp: 30 tablet, Rfl: 0   fluticasone (FLONASE) 50 MCG/ACT nasal spray, Place 2 sprays into both nostrils daily., Disp: 16 g, Rfl: 6   FT MUCUS RELIEF 12HR MAX STR 1200 MG TB12, Take 1 tablet by mouth 2 (two) times daily., Disp: , Rfl:    LORazepam  (ATIVAN ) 0.5 MG tablet, Take 1 tablet (0.5 mg total) by mouth daily as needed for anxiety., Disp: 20 tablet, Rfl: 1   montelukast  (SINGULAIR ) 10 MG tablet, TAKE 1 TABLET BY MOUTH EVERYDAY AT BEDTIME, Disp: 90 tablet, Rfl: 3   Multiple Vitamin (MULTIVITAMIN) tablet, Take 1 tablet by mouth daily., Disp: , Rfl:    Probiotic Product (TRUBIOTICS PO), Take by mouth daily., Disp: , Rfl:    acetaminophen  (TYLENOL ) 500 MG tablet, Take 1 tablet (500 mg total) by mouth  every 6 (six) hours as needed. (Patient not taking: Reported on 07/28/2024), Disp: 30 tablet, Rfl: 0 Social History   Socioeconomic History   Marital status: Married    Spouse name: Not on file   Number of children: Not on file   Years of education: Not on file   Highest education level: Not on file  Occupational History   Not on file  Tobacco Use   Smoking status: Former    Current packs/day: 0.00    Types: Cigarettes    Quit date: 09/04/2012    Years since quitting: 11.9   Smokeless tobacco: Never  Vaping Use   Vaping status: Never Used  Substance and Sexual Activity   Alcohol use: Yes    Alcohol/week: 16.0 standard drinks of alcohol    Types: 16 Cans of beer per week   Drug use: Yes    Types: Marijuana    Comment: last use 05/03/21 3 totes   Sexual activity: Yes    Birth control/protection: None  Other Topics Concern   Not on file  Social History Narrative   Not on file   Social Drivers of Health   Financial Resource Strain: Not on file  Food Insecurity: Not on file  Transportation Needs: Not on file  Physical Activity: Not on file  Stress: Not on file  Social Connections:  Unknown (05/04/2023)   Received from [facility]   Social Network    Social Network: Not on file  Intimate Partner Violence: Unknown (05/04/2023)   Received from [facility]   HITS    Physically Hurt: Not on file    Insult or Talk Down To: Not on file    Threaten Physical Harm: Not on file    Scream or Curse: Not on file   Family History  Problem Relation Age of Onset   Colon polyps Father    Cancer Father        stomach   Stomach cancer Father    COPD Brother    Esophageal cancer Maternal Uncle    Colon polyps Paternal Aunt    Colon polyps Paternal Uncle    Rectal cancer Cousin    Colon cancer Neg Hx     Objective: Office vital signs reviewed. BP 131/75   Pulse 77   Temp (!) 97.5 F (36.4 C)   Ht 5' 6 (1.676 m)   Wt 176 lb (79.8 kg)   LMP 03/10/2013   SpO2 95%    BMI 28.41 kg/m   Physical Examination:  General: Awake, alert, appears tired, No acute distress HEENT: Normal    Ears: Tympanic membranes intact.  She has dulled light reflex with apparent purulent material behind the TM on the right.  Left TM with clear effusion but bulging and mild erythema at the external auditory canal.    Eyes: PERRLA, extraocular membranes intact, sclera white    Nose: nasal turbinates moist, clear nasal discharge    Throat: moist mucus membranes     Assessment/ Plan: 58 y.o. female   Rhinosinusitis - Plan: methylPREDNISolone acetate (DEPO-MEDROL) injection 40 mg  Otalgia of left ear - Plan: methylPREDNISolone acetate (DEPO-MEDROL) injection 40 mg   Suspect eustachian tube dysfunction causing otalgia of the left ear.  We discussed OMT maneuver for eustachian tube drainage.  She was given a Depo-Medrol shot here today we also reviewed how to utilize the nasal spray appropriately.  She will continue Omnicef as the right ear certainly looks like it is infected.  She will follow-up as needed   Michelle CHRISTELLA Fielding, DO [facility] [phone]

## 2024-07-28 NOTE — Patient Instructions (Signed)
 Eustachian Tube Dysfunction  Eustachian tube dysfunction refers to a condition in which a blockage develops in the narrow passage that connects the middle ear to the back of the nose (eustachian tube). The eustachian tube regulates air pressure in the middle ear by letting air move between the ear and nose. It also helps to drain fluid from the middle ear space. Eustachian tube dysfunction can affect one or both ears. When the eustachian tube does not function properly, air pressure, fluid, or both can build up in the middle ear. What are the causes? This condition occurs when the eustachian tube becomes blocked or cannot open normally. Common causes of this condition include: Ear infections. Colds and other infections that affect the nose, mouth, and throat (upper respiratory tract). Allergies. Irritation from cigarette smoke. Irritation from stomach acid coming up into the esophagus (gastroesophageal reflux). The esophagus is the part of the body that moves food from the mouth to the stomach. Sudden changes in air pressure, such as from descending in an airplane or scuba diving. Abnormal growths in the nose or throat, such as: Growths that line the nose (nasal polyps). Abnormal growth of cells (tumors). Enlarged tissue at the back of the throat (adenoids). What increases the risk? You are more likely to develop this condition if: You smoke. You are overweight. You are a child who has: Certain birth defects of the mouth, such as cleft palate. Large tonsils or adenoids. What are the signs or symptoms? Common symptoms of this condition include: A feeling of fullness in the ear. Ear pain. Clicking or popping noises in the ear. Ringing in the ear (tinnitus). Hearing loss. Loss of balance. Dizziness. Symptoms may get worse when the air pressure around you changes, such as when you travel to an area of high elevation, fly on an airplane, or go scuba diving. How is this diagnosed? This  condition may be diagnosed based on: Your symptoms. A physical exam of your ears, nose, and throat. Tests, such as those that measure: The movement of your eardrum. Your hearing (audiometry). How is this treated? Treatment depends on the cause and severity of your condition. In mild cases, you may relieve your symptoms by moving air into your ears. This is called "popping the ears." In more severe cases, or if you have symptoms of fluid in your ears, treatment may include: Medicines to relieve congestion (decongestants). Medicines that treat allergies (antihistamines). Nasal sprays or ear drops that contain medicines that reduce swelling (steroids). A procedure to drain the fluid in your eardrum. In this procedure, a small tube may be placed in the eardrum to: Drain the fluid. Restore the air in the middle ear space. A procedure to insert a balloon device through the nose to inflate the opening of the eustachian tube (balloon dilation). Follow these instructions at home: Lifestyle Do not do any of the following until your health care provider approves: Travel to high altitudes. Fly in airplanes. Work in a Estate agent or room. Scuba dive. Do not use any products that contain nicotine or tobacco. These products include cigarettes, chewing tobacco, and vaping devices, such as e-cigarettes. If you need help quitting, ask your health care provider. Keep your ears dry. Wear fitted earplugs during showering and bathing. Dry your ears completely after. General instructions Take over-the-counter and prescription medicines only as told by your health care provider. Use techniques to help pop your ears as recommended by your health care provider. These may include: Chewing gum. Yawning. Frequent, forceful swallowing.  Closing your mouth, holding your nose closed, and gently blowing as if you are trying to blow air out of your nose. Keep all follow-up visits. This is important. Contact a  health care provider if: Your symptoms do not go away after treatment. Your symptoms come back after treatment. You are unable to pop your ears. You have: A fever. Pain in your ear. Pain in your head or neck. Fluid draining from your ear. Your hearing suddenly changes. You become very dizzy. You lose your balance. Get help right away if: You have a sudden, severe increase in any of your symptoms. Summary Eustachian tube dysfunction refers to a condition in which a blockage develops in the eustachian tube. It can be caused by ear infections, allergies, inhaled irritants, or abnormal growths in the nose or throat. Symptoms may include ear pain or fullness, hearing loss, or ringing in the ears. Mild cases are treated with techniques to unblock the ears, such as yawning or chewing gum. More severe cases are treated with medicines or procedures. This information is not intended to replace advice given to you by your health care provider. Make sure you discuss any questions you have with your health care provider. Document Revised: 12/02/2020 Document Reviewed: 12/02/2020 Elsevier Patient Education  2024 [employer].

## 2024-07-28 NOTE — Addendum Note (Signed)
 Addended by: JOLINDA NORENE HERO on: 07/28/2024 09:41 AM   Modules accepted: Orders

## 2025-04-13 ENCOUNTER — Encounter: Payer: Self-pay | Admitting: Family Medicine
